# Patient Record
Sex: Female | Born: 1960 | Race: White | Hispanic: No | Smoking: Never smoker
Health system: Southern US, Community
[De-identification: ages and names within clinical notes are randomized; demographics above are authoritative.]

## PROBLEM LIST (undated history)

## (undated) DIAGNOSIS — M1990 Unspecified osteoarthritis, unspecified site: Secondary | ICD-10-CM

## (undated) DIAGNOSIS — Z806 Family history of leukemia: Secondary | ICD-10-CM

## (undated) DIAGNOSIS — M2550 Pain in unspecified joint: Secondary | ICD-10-CM

## (undated) DIAGNOSIS — Z803 Family history of malignant neoplasm of breast: Secondary | ICD-10-CM

## (undated) DIAGNOSIS — R609 Edema, unspecified: Secondary | ICD-10-CM

## (undated) DIAGNOSIS — N6099 Unspecified benign mammary dysplasia of unspecified breast: Secondary | ICD-10-CM

## (undated) DIAGNOSIS — D649 Anemia, unspecified: Secondary | ICD-10-CM

## (undated) DIAGNOSIS — Z8042 Family history of malignant neoplasm of prostate: Secondary | ICD-10-CM

## (undated) HISTORY — PX: TONSILLECTOMY: SUR1361

## (undated) HISTORY — DX: Edema, unspecified: R60.9

## (undated) HISTORY — PX: DILATION AND CURETTAGE OF UTERUS: SHX78

## (undated) HISTORY — PX: WISDOM TOOTH EXTRACTION: SHX21

## (undated) HISTORY — DX: Pain in unspecified joint: M25.50

## (undated) HISTORY — DX: Unspecified benign mammary dysplasia of unspecified breast: N60.99

## (undated) HISTORY — PX: BREAST LUMPECTOMY: SHX2

## (undated) HISTORY — DX: Family history of malignant neoplasm of prostate: Z80.42

## (undated) HISTORY — PX: NASAL SINUS SURGERY: SHX719

## (undated) HISTORY — PX: OTHER SURGICAL HISTORY: SHX169

## (undated) HISTORY — DX: Family history of leukemia: Z80.6

## (undated) HISTORY — DX: Family history of malignant neoplasm of breast: Z80.3

---

## 2005-10-30 ENCOUNTER — Encounter: Admission: RE | Admit: 2005-10-30 | Discharge: 2005-10-30 | Payer: Self-pay | Admitting: Obstetrics and Gynecology

## 2005-12-03 ENCOUNTER — Encounter: Admission: RE | Admit: 2005-12-03 | Discharge: 2005-12-03 | Payer: Self-pay | Admitting: Obstetrics and Gynecology

## 2005-12-03 ENCOUNTER — Encounter (INDEPENDENT_AMBULATORY_CARE_PROVIDER_SITE_OTHER): Payer: Self-pay | Admitting: Specialist

## 2006-11-11 ENCOUNTER — Encounter: Admission: RE | Admit: 2006-11-11 | Discharge: 2006-11-11 | Payer: Self-pay | Admitting: Obstetrics and Gynecology

## 2006-11-13 ENCOUNTER — Encounter: Admission: RE | Admit: 2006-11-13 | Discharge: 2006-11-13 | Payer: Self-pay | Admitting: Obstetrics and Gynecology

## 2006-12-09 ENCOUNTER — Encounter: Admission: RE | Admit: 2006-12-09 | Discharge: 2006-12-09 | Payer: Self-pay | Admitting: Obstetrics and Gynecology

## 2007-06-10 ENCOUNTER — Encounter: Admission: RE | Admit: 2007-06-10 | Discharge: 2007-06-10 | Payer: Self-pay | Admitting: Obstetrics and Gynecology

## 2007-11-16 ENCOUNTER — Encounter: Admission: RE | Admit: 2007-11-16 | Discharge: 2007-11-16 | Payer: Self-pay | Admitting: Obstetrics and Gynecology

## 2008-11-16 ENCOUNTER — Encounter: Admission: RE | Admit: 2008-11-16 | Discharge: 2008-11-16 | Payer: Self-pay | Admitting: Obstetrics and Gynecology

## 2009-11-26 ENCOUNTER — Encounter: Admission: RE | Admit: 2009-11-26 | Discharge: 2009-11-26 | Payer: Self-pay | Admitting: Obstetrics and Gynecology

## 2010-11-04 ENCOUNTER — Other Ambulatory Visit: Payer: Self-pay | Admitting: Obstetrics and Gynecology

## 2010-11-04 DIAGNOSIS — Z1231 Encounter for screening mammogram for malignant neoplasm of breast: Secondary | ICD-10-CM

## 2010-12-03 ENCOUNTER — Ambulatory Visit: Payer: Self-pay

## 2010-12-09 ENCOUNTER — Ambulatory Visit
Admission: RE | Admit: 2010-12-09 | Discharge: 2010-12-09 | Disposition: A | Discharge status: Home / Self Care | Payer: Managed Care, Other (non HMO) | Source: Ambulatory Visit | Attending: Obstetrics and Gynecology | Admitting: Obstetrics and Gynecology

## 2010-12-09 DIAGNOSIS — Z1231 Encounter for screening mammogram for malignant neoplasm of breast: Secondary | ICD-10-CM

## 2010-12-13 ENCOUNTER — Other Ambulatory Visit: Payer: Self-pay | Admitting: Obstetrics and Gynecology

## 2010-12-13 DIAGNOSIS — R928 Other abnormal and inconclusive findings on diagnostic imaging of breast: Secondary | ICD-10-CM

## 2010-12-27 ENCOUNTER — Other Ambulatory Visit: Payer: Self-pay | Admitting: Obstetrics and Gynecology

## 2010-12-27 ENCOUNTER — Ambulatory Visit
Admission: RE | Admit: 2010-12-27 | Discharge: 2010-12-27 | Disposition: A | Discharge status: Home / Self Care | Payer: Managed Care, Other (non HMO) | Source: Ambulatory Visit | Attending: Obstetrics and Gynecology | Admitting: Obstetrics and Gynecology

## 2010-12-27 DIAGNOSIS — R928 Other abnormal and inconclusive findings on diagnostic imaging of breast: Secondary | ICD-10-CM

## 2010-12-27 DIAGNOSIS — N6310 Unspecified lump in the right breast, unspecified quadrant: Secondary | ICD-10-CM

## 2011-11-18 ENCOUNTER — Other Ambulatory Visit: Payer: Self-pay | Admitting: Obstetrics and Gynecology

## 2011-11-18 DIAGNOSIS — Z1231 Encounter for screening mammogram for malignant neoplasm of breast: Secondary | ICD-10-CM

## 2012-01-12 ENCOUNTER — Ambulatory Visit
Admission: RE | Admit: 2012-01-12 | Discharge: 2012-01-12 | Disposition: A | Discharge status: Home / Self Care | Payer: Managed Care, Other (non HMO) | Source: Ambulatory Visit | Attending: Obstetrics and Gynecology | Admitting: Obstetrics and Gynecology

## 2012-01-12 DIAGNOSIS — Z1231 Encounter for screening mammogram for malignant neoplasm of breast: Secondary | ICD-10-CM

## 2012-11-02 ENCOUNTER — Ambulatory Visit (HOSPITAL_COMMUNITY)
Admission: RE | Admit: 2012-11-02 | Discharge: 2012-11-02 | Disposition: A | Discharge status: Home / Self Care | Payer: Managed Care, Other (non HMO) | Source: Ambulatory Visit | Attending: Cardiology | Admitting: Cardiology

## 2012-11-02 ENCOUNTER — Other Ambulatory Visit (HOSPITAL_COMMUNITY): Payer: Self-pay | Admitting: Cardiology

## 2012-11-02 DIAGNOSIS — R609 Edema, unspecified: Secondary | ICD-10-CM | POA: Insufficient documentation

## 2012-11-02 DIAGNOSIS — M79604 Pain in right leg: Secondary | ICD-10-CM

## 2012-11-02 DIAGNOSIS — R600 Localized edema: Secondary | ICD-10-CM

## 2012-11-02 DIAGNOSIS — M7989 Other specified soft tissue disorders: Secondary | ICD-10-CM

## 2012-11-02 NOTE — Progress Notes (Signed)
VASCULAR LAB PRELIMINARY  PRELIMINARY  PRELIMINARY  PRELIMINARY  Bilateral lower extremity venous duplex  completed.    Preliminary report:  Bilateral:  No evidence of DVT, superficial thrombosis, or Baker's Cyst.   Abdulahad Mederos, RVT 11/02/2012, 3:58 PM

## 2012-12-13 ENCOUNTER — Encounter: Payer: Self-pay | Admitting: Cardiology

## 2012-12-13 ENCOUNTER — Encounter: Payer: Self-pay | Admitting: *Deleted

## 2012-12-13 DIAGNOSIS — M2550 Pain in unspecified joint: Secondary | ICD-10-CM | POA: Insufficient documentation

## 2012-12-14 ENCOUNTER — Ambulatory Visit (INDEPENDENT_AMBULATORY_CARE_PROVIDER_SITE_OTHER): Payer: Managed Care, Other (non HMO) | Admitting: Cardiology

## 2012-12-14 ENCOUNTER — Encounter: Payer: Self-pay | Admitting: Cardiology

## 2012-12-14 VITALS — BP 121/64 | HR 70 | Ht 68.0 in | Wt 196.0 lb

## 2012-12-14 DIAGNOSIS — R0600 Dyspnea, unspecified: Secondary | ICD-10-CM | POA: Insufficient documentation

## 2012-12-14 DIAGNOSIS — R0609 Other forms of dyspnea: Secondary | ICD-10-CM | POA: Insufficient documentation

## 2012-12-14 DIAGNOSIS — R609 Edema, unspecified: Secondary | ICD-10-CM

## 2012-12-14 DIAGNOSIS — R0989 Other specified symptoms and signs involving the circulatory and respiratory systems: Secondary | ICD-10-CM

## 2012-12-14 DIAGNOSIS — R600 Localized edema: Secondary | ICD-10-CM | POA: Insufficient documentation

## 2012-12-14 MED ORDER — FUROSEMIDE 20 MG PO TABS: 20.0000 mg | ORAL_TABLET | ORAL | Status: DC | PRN

## 2012-12-14 NOTE — Progress Notes (Signed)
  [address] [place], [place]  [address] Phone: [phone] Fax:  [phone]  Date:  12/14/2012   ID:  Beverly Munoz, DOB 03-Apr-1960, MRN [id number]  PCP:  Pcp Not In System  Cardiologist:  Armanda Magic, MD  History of Present Illness: Beverly Munoz is a 52 y.o. female who saw me recently for SOB and LE edema after a trip to [place].  She was initially placed on Lasix with some improvement in LE edema.  Her SOB with mainly with exertion.  2D echo showed normal LVF with trivial TR, normal TSH/BNP/d-dimer, no DVT by LE venous dopplers.  She presents today for followup. She occasionally has some LE edema when standing on her legs for long periods of time.  She still has some DOE when going up stairs.   Wt Readings from Last 3 Encounters:  12/14/12 196 lb (88.905 kg)     Past Medical History  Diagnosis Date  . Pain in joint   . Edema     no DVT on venous dopplers, nl LVF on echo, normal BNP/TSH    Current Outpatient Prescriptions  Medication Sig Dispense Refill  . Ascorbic Acid (VITAMIN C) 100 MG tablet Take 100 mg by mouth daily.      . B Complex Vitamins (VITAMIN B COMPLEX PO) Take 1 tablet by mouth daily.      . Cholecalciferol (VITAMIN D-3) 1000 UNITS CAPS Take 1 capsule by mouth daily.      . cycloSPORINE (RESTASIS) 0.05 % ophthalmic emulsion 1 drop 2 (two) times daily.      . fish oil-omega-3 fatty acids 1000 MG capsule Take 2 g by mouth daily.      . Multiple Vitamin (MULTIVITAMIN) capsule Take 1 capsule by mouth daily.       No current facility-administered medications for this visit.    Allergies:    Allergies  Allergen Reactions  . Penicillins     Social History:  The patient  reports that she has never smoked. She does not have any smokeless tobacco history on file. She reports that she drinks alcohol. She reports that she does not use illicit drugs.   Family History:  The patient's family history includes Breast cancer in her mother; Heart failure in her  father.   ROS:  Please see the history of present illness.      All other systems reviewed and negative.   PHYSICAL EXAM: VS:  BP 121/64  Pulse 70  Ht 5\' 8"  (1.727 m)  Wt 196 lb (88.905 kg)  BMI 29.81 kg/m2 Well nourished, well developed, in no acute distress HEENT: normal Neck: no JVD Cardiac:  normal S1, S2; RRR; no murmur Lungs:  clear to auscultation bilaterally, no wheezing, rhonchi or rales Abd: soft, nontender, no hepatomegaly Ext: no edema Skin: warm and dry Neuro:  CNs 2-12 intact, no focal abnormalities noted       ASSESSMENT AND PLAN:  1. LE edema - none on exam today.  She is getting ready to go on a trip to [place] for Thanksgiving and she is concerned that she may some LE edema.  I have given her a prescription for Lasix 20mg  to take daily as needed for edema. I have also encouraged her to walk a lot on the plane. 2. DOE - I suspect that this is due to obesity.  I will get a nuclear stress to rule out ischemia.  Followup PRN  Signed, Armanda Magic, MD 12/14/2012 3:36 PM

## 2012-12-14 NOTE — Patient Instructions (Signed)
Start Lasix 20 MG PRN for swelling  Your physician has requested that you have en exercise stress myoview. For further information please visit [email or web address] Please follow instruction sheet, as given.  Your physician recommends that you schedule a follow-up appointment in: As needed with Dr. Mayford Knife

## 2012-12-28 ENCOUNTER — Ambulatory Visit (HOSPITAL_COMMUNITY): Payer: Managed Care, Other (non HMO) | Attending: Cardiology | Admitting: Radiology

## 2012-12-28 ENCOUNTER — Encounter: Payer: Self-pay | Admitting: Cardiology

## 2012-12-28 VITALS — BP 106/70 | HR 67 | Ht 68.0 in | Wt 196.0 lb

## 2012-12-28 DIAGNOSIS — R0602 Shortness of breath: Secondary | ICD-10-CM

## 2012-12-28 DIAGNOSIS — R0600 Dyspnea, unspecified: Secondary | ICD-10-CM

## 2012-12-28 DIAGNOSIS — R0609 Other forms of dyspnea: Secondary | ICD-10-CM | POA: Insufficient documentation

## 2012-12-28 DIAGNOSIS — R0989 Other specified symptoms and signs involving the circulatory and respiratory systems: Secondary | ICD-10-CM | POA: Insufficient documentation

## 2012-12-28 MED ORDER — TECHNETIUM TC 99M SESTAMIBI GENERIC - CARDIOLITE
11.0000 | Freq: Once | INTRAVENOUS | Status: AC | PRN
  Administered 2012-12-28: 11 via INTRAVENOUS

## 2012-12-28 MED ORDER — TECHNETIUM TC 99M SESTAMIBI GENERIC - CARDIOLITE
33.0000 | Freq: Once | INTRAVENOUS | Status: AC | PRN
  Administered 2012-12-28: 33 via INTRAVENOUS

## 2012-12-28 NOTE — Progress Notes (Signed)
[facility] SITE 3 NUCLEAR MED [address] [place], [place] [address] [phone]    Cardiology Nuclear Med Study  Beverly Munoz is a 52 y.o. female     MRN : [id number]     DOB: [birth year]  Procedure Date: 12/28/2012  Nuclear Med Background Indication for Stress Test:  Evaluation for Ischemia History:  No known CAD, Echo 10/2012 EF 63% Cardiac Risk Factors: None  Symptoms:  DOE   Nuclear Pre-Procedure Caffeine/Decaff Intake:  None > 12 hrs  NPO After: 7:00pm   Lungs:  clear O2 Sat: 98% on room air. IV 0.9% NS with Angio Cath:  22g  IV Site: R Forearm x 1, tolerated well IV Started by:  Irean Hong, RN  Chest Size (in):  40 Cup Size: C  Height: 5\' 8"  (1.727 m)  Weight:  196 lb (88.905 kg)  BMI:  Body mass index is 29.81 kg/(m^2). Tech Comments:  NA    Nuclear Med Study 1 or 2 day study: 1 day  Stress Test Type:  Stress  Reading MD: Armanda Magic, MD  Order Authorizing Provider:  Armanda Magic, MD  Resting Radionuclide: Technetium 53m Sestamibi  Resting Radionuclide Dose: 11.0 mCi   Stress Radionuclide:  Technetium 83m Sestamibi  Stress Radionuclide Dose: 33.0 mCi           Stress Protocol Rest HR: 67 Stress HR: 173  Rest BP: 106/70 Stress BP: 138/64  Exercise Time (min): 7:00 METS: 8.5   Predicted Max HR: 170 bpm % Max HR: 101.76 bpm Rate Pressure Product: 24401   Dose of Adenosine (mg):  n/a Dose of Lexiscan: n/a mg  Dose of Atropine (mg): n/a Dose of Dobutamine: n/a mcg/kg/min (at max HR)  Stress Test Technologist: Nelson Chimes, BS-ES  Nuclear Technologist:  Doyne Keel, CNMT     Rest Procedure:  Myocardial perfusion imaging was performed at rest 45 minutes following the intravenous administration of Technetium 8m Sestamibi. Rest ECG: NSR - Normal EKG  Stress Procedure:  The patient exercised on the treadmill utilizing the Bruce Protocol for 7:00 minutes. The patient stopped due to SOB and denied any chest pain.  Technetium 39m Sestamibi was  injected at peak exercise and myocardial perfusion imaging was performed after a brief delay. Symptoms resolved in recovery. Stress ECG: 1mm of J point depression with horizontal ST segment depression  QPS Raw Data Images:  There is interference from nuclear activity from structures below the diaphragm. This does not affect the ability to read the study. Stress Images:  There is decreased uptake in the anterior wall primarily in the mid anterior wall.   Rest Images:  Normal homogeneous uptake in all areas of the myocardium. Subtraction (SDS):  These findings are consistent with ischemia. Transient Ischemic Dilatation (Normal <1.22):  0.93 Lung/Heart Ratio (Normal <0.45):  0.29  Quantitative Gated Spect Images QGS EDV:  69 ml QGS ESV:  25 ml  Impression Exercise Capacity:  Good exercise capacity. BP Response:  Normal blood pressure response. Clinical Symptoms:  There is dyspnea. ECG Impression:  Significant ST abnormalities consistent with ischemia. Comparison with Prior Nuclear Study: No images to compare  Overall Impression:  Intermediate risk stress nuclear study small reversible defect in the mid anterior wall most c/w with ischemia in a diagonal branch although cannot rule out LAD disease.  This could also be due to variations in breast attenuation.  Clinical correlation is recommended.  LV Ejection Fraction: 64%.  LV Wall Motion:  NL LV Function; NL Wall  Motion   Signed:   Armanda Magic, MD 12/29/2012

## 2012-12-29 ENCOUNTER — Other Ambulatory Visit: Payer: Self-pay

## 2012-12-29 DIAGNOSIS — Z1231 Encounter for screening mammogram for malignant neoplasm of breast: Secondary | ICD-10-CM

## 2012-12-30 ENCOUNTER — Other Ambulatory Visit: Payer: Self-pay | Admitting: General Surgery

## 2012-12-30 ENCOUNTER — Encounter (HOSPITAL_COMMUNITY): Payer: Self-pay | Admitting: Pharmacy Technician

## 2012-12-30 ENCOUNTER — Encounter: Payer: Self-pay | Admitting: General Surgery

## 2012-12-30 DIAGNOSIS — R0602 Shortness of breath: Secondary | ICD-10-CM

## 2012-12-31 ENCOUNTER — Telehealth: Payer: Self-pay | Admitting: Cardiology

## 2012-12-31 ENCOUNTER — Other Ambulatory Visit (INDEPENDENT_AMBULATORY_CARE_PROVIDER_SITE_OTHER): Payer: Managed Care, Other (non HMO)

## 2012-12-31 DIAGNOSIS — R0602 Shortness of breath: Secondary | ICD-10-CM

## 2012-12-31 LAB — CBC WITH DIFFERENTIAL/PLATELET
Eosinophils Relative: 2.6 % (ref 0.0–5.0)
HCT: 38.5 % (ref 36.0–46.0)
Lymphs Abs: 1.8 10*3/uL (ref 0.7–4.0)
MCHC: 33.9 g/dL (ref 30.0–36.0)
MCV: 84.6 fl (ref 78.0–100.0)
Monocytes Absolute: 0.2 10*3/uL (ref 0.1–1.0)
Platelets: 260 10*3/uL (ref 150.0–400.0)
WBC: 5.9 10*3/uL (ref 4.5–10.5)

## 2012-12-31 LAB — BASIC METABOLIC PANEL
BUN: 12 mg/dL (ref 6–23)
CO2: 27 mEq/L (ref 19–32)
Chloride: 105 mEq/L (ref 96–112)
Glucose, Bld: 100 mg/dL — ABNORMAL HIGH (ref 70–99)
Potassium: 4.1 mEq/L (ref 3.5–5.1)

## 2012-12-31 LAB — PROTIME-INR: Prothrombin Time: 11.3 s (ref 10.2–12.4)

## 2012-12-31 NOTE — Telephone Encounter (Signed)
Called and spoke to pt. I will get paper work and fax over to pts office at [phone]

## 2012-12-31 NOTE — Telephone Encounter (Signed)
Follow UP   Pt called for paperwork that she forgot to pick up for procedure on Monday // She asks that you please fax to [phone] (public fax) or she can simply come by to pick it up// Please advise

## 2013-01-03 ENCOUNTER — Ambulatory Visit (HOSPITAL_COMMUNITY)
Admission: RE | Admit: 2013-01-03 | Discharge: 2013-01-03 | Disposition: A | Discharge status: Home / Self Care | Payer: Managed Care, Other (non HMO) | Source: Ambulatory Visit | Attending: Cardiology | Admitting: Cardiology

## 2013-01-03 ENCOUNTER — Encounter (HOSPITAL_COMMUNITY)
Admission: RE | Disposition: A | Discharge status: Home / Self Care | Payer: Self-pay | Source: Ambulatory Visit | Attending: Cardiology

## 2013-01-03 DIAGNOSIS — Z803 Family history of malignant neoplasm of breast: Secondary | ICD-10-CM | POA: Insufficient documentation

## 2013-01-03 DIAGNOSIS — R9439 Abnormal result of other cardiovascular function study: Secondary | ICD-10-CM

## 2013-01-03 DIAGNOSIS — Z6829 Body mass index (BMI) 29.0-29.9, adult: Secondary | ICD-10-CM | POA: Insufficient documentation

## 2013-01-03 DIAGNOSIS — E669 Obesity, unspecified: Secondary | ICD-10-CM | POA: Insufficient documentation

## 2013-01-03 DIAGNOSIS — R0600 Dyspnea, unspecified: Secondary | ICD-10-CM

## 2013-01-03 DIAGNOSIS — R0602 Shortness of breath: Secondary | ICD-10-CM | POA: Insufficient documentation

## 2013-01-03 DIAGNOSIS — R0609 Other forms of dyspnea: Secondary | ICD-10-CM

## 2013-01-03 DIAGNOSIS — R0989 Other specified symptoms and signs involving the circulatory and respiratory systems: Secondary | ICD-10-CM

## 2013-01-03 DIAGNOSIS — R609 Edema, unspecified: Secondary | ICD-10-CM | POA: Insufficient documentation

## 2013-01-03 DIAGNOSIS — R931 Abnormal findings on diagnostic imaging of heart and coronary circulation: Secondary | ICD-10-CM | POA: Diagnosis present

## 2013-01-03 DIAGNOSIS — R600 Localized edema: Secondary | ICD-10-CM

## 2013-01-03 HISTORY — PX: LEFT HEART CATHETERIZATION WITH CORONARY ANGIOGRAM: SHX5451

## 2013-01-03 LAB — PREGNANCY, URINE: Preg Test, Ur: NEGATIVE

## 2013-01-03 SURGERY — LEFT HEART CATHETERIZATION WITH CORONARY ANGIOGRAM
Anesthesia: LOCAL

## 2013-01-03 MED ORDER — MIDAZOLAM HCL 2 MG/2ML IJ SOLN
INTRAMUSCULAR | Status: AC
  Filled 2013-01-03: qty 2

## 2013-01-03 MED ORDER — NITROGLYCERIN 0.2 MG/ML ON CALL CATH LAB
INTRAVENOUS | Status: AC
  Filled 2013-01-03: qty 1

## 2013-01-03 MED ORDER — FENTANYL CITRATE 0.05 MG/ML IJ SOLN
INTRAMUSCULAR | Status: AC
  Filled 2013-01-03: qty 2

## 2013-01-03 MED ORDER — SODIUM CHLORIDE 0.9 % IV SOLN: 1.0000 mL/kg/h | INTRAVENOUS | Status: DC

## 2013-01-03 MED ORDER — SODIUM CHLORIDE 0.9 % IV SOLN: 250.0000 mL | INTRAVENOUS | Status: DC | PRN

## 2013-01-03 MED ORDER — HEPARIN (PORCINE) IN NACL 2-0.9 UNIT/ML-% IJ SOLN
INTRAMUSCULAR | Status: AC
  Filled 2013-01-03: qty 1000

## 2013-01-03 MED ORDER — ASPIRIN 81 MG PO CHEW
81.0000 mg | CHEWABLE_TABLET | ORAL | Status: AC
  Administered 2013-01-03: 81 mg via ORAL

## 2013-01-03 MED ORDER — SODIUM CHLORIDE 0.9 % IJ SOLN: 3.0000 mL | INTRAMUSCULAR | Status: DC | PRN

## 2013-01-03 MED ORDER — ACETAMINOPHEN 325 MG PO TABS
650.0000 mg | ORAL_TABLET | ORAL | Status: DC | PRN
  Administered 2013-01-03: 650 mg via ORAL

## 2013-01-03 MED ORDER — SODIUM CHLORIDE 0.9 % IJ SOLN: 3.0000 mL | Freq: Two times a day (BID) | INTRAMUSCULAR | Status: DC

## 2013-01-03 MED ORDER — TRAMADOL HCL 50 MG PO TABS
100.0000 mg | ORAL_TABLET | Freq: Once | ORAL | Status: AC
  Administered 2013-01-03: 100 mg via ORAL
  Filled 2013-01-03: qty 2

## 2013-01-03 MED ORDER — ASPIRIN 81 MG PO CHEW
CHEWABLE_TABLET | ORAL | Status: AC
  Filled 2013-01-03: qty 1

## 2013-01-03 MED ORDER — SODIUM CHLORIDE 0.9 % IV SOLN
INTRAVENOUS | Status: DC
  Administered 2013-01-03: 07:00:00 via INTRAVENOUS

## 2013-01-03 MED ORDER — ONDANSETRON HCL 4 MG/2ML IJ SOLN: 4.0000 mg | Freq: Four times a day (QID) | INTRAMUSCULAR | Status: DC | PRN

## 2013-01-03 MED ORDER — LIDOCAINE HCL (PF) 1 % IJ SOLN
INTRAMUSCULAR | Status: AC
  Filled 2013-01-03: qty 30

## 2013-01-03 MED ORDER — ACETAMINOPHEN 325 MG PO TABS
ORAL_TABLET | ORAL | Status: AC
  Filled 2013-01-03: qty 2

## 2013-01-03 NOTE — Interval H&P Note (Signed)
History and Physical Interval Note:  01/03/2013 7:41 AM  Beverly Munoz  has presented today for surgery, with the diagnosis of abnormal stress test  The various methods of treatment have been discussed with the patient and family. After consideration of risks, benefits and other options for treatment, the patient has consented to  Procedure(s): LEFT HEART CATHETERIZATION WITH CORONARY ANGIOGRAM (N/A) as a surgical intervention .  The patient's history has been reviewed, patient examined, no change in status, stable for surgery.  I have reviewed the patient's chart and labs.  Questions were answered to the patient's satisfaction.     Zacari Stiff R

## 2013-01-03 NOTE — CV Procedure (Signed)
   PROCEDURE:  Left heart catheterization with selective coronary angiography, left ventriculogram.  INDICATIONS:    The risks, benefits, and details of the procedure were explained to the patient.  The patient verbalized understanding and wanted to proceed.  Informed written consent was obtained.  PROCEDURE TECHNIQUE:  After Xylocaine anesthesia a 65F sheath was placed in the right femoral artery with a single anterior needle wall stick.   Left coronary angiography was done using a Judkins L4 guide catheter.  Right coronary angiography was done using a Judkins R4 guide catheter.  Left ventriculography was done using a pigtail catheter.    CONTRAST:  Total of 75 cc.  COMPLICATIONS:  None.    HEMODYNAMICS:  Aortic pressure was 125/54mmHg; LV pressure was 110/63mmHg; LVEDP 12-74mmHg.  There was no gradient between the left ventricle and aorta.    ANGIOGRAPHIC DATA:   The left main coronary artery is widely patent and bifurcates into an LAD and left circumflex arteries.  The left anterior descending artery is widely patent and gives rise to a large first diagonal which is widely patent.    The left circumflex artery is widely patent and give rise to a small first OM which is patent.  The ongoing circ traverses the AV groove and gives rise to a large OM1 which is widely patent and bifurcates distally into 2 daughter branches which are widely patent.  The ongoing circ is small and patent.  The right coronary artery is widely patent throughout its course and distally give rise to a PDA and PL both of which are patent.  LEFT VENTRICULOGRAM:  Left ventricular angiogram was done in the 30 RAO projection and revealed normal left ventricular wall motion and systolic function with an estimated ejection fraction of 55%.  LVEDP was 12-15 mmHg.  IMPRESSIONS:  1. Normal left main coronary artery. 2. Normal left anterior descending artery and its branches. 3. Normal left circumflex artery and its  branches. 4. Normal right coronary artery. 5. Normal left ventricular systolic function.  LVEDP 12-15 mmHg.  Ejection fraction 55%.  RECOMMENDATION:   1.  D/C home once bedrest and IVF hydration complete 2.  Followup with my NP in office for groin check in 2 weeks 3.  Followup with PCP for workup of noncardiac SOB

## 2013-01-03 NOTE — H&P (Signed)
ID: Beverly Munoz, DOB [birth year], MRN [id number]  PCP: Pcp Not In System  Cardiologist: Armanda Magic, MD  History of Present Illness:  Beverly Munoz is a 52 y.o. female who saw me recently for SOB and LE edema after a trip to [place]. She was initially placed on Lasix with some improvement in LE edema. Her SOB with mainly with exertion. 2D echo showed normal LVF with trivial TR, normal TSH/BNP/d-dimer, no DVT by LE venous dopplers. She presents today for followup. She occasionally has some LE edema when standing on her legs for long periods of time. She still has some DOE when going up stairs.  Wt Readings from Last 3 Encounters:   12/14/12  196 lb (88.905 kg)    Past Medical History   Diagnosis  Date   .  Pain in joint    .  Edema      no DVT on venous dopplers, nl LVF on echo, normal BNP/TSH    Current Outpatient Prescriptions   Medication  Sig  Dispense  Refill   .  Ascorbic Acid (VITAMIN C) 100 MG tablet  Take 100 mg by mouth daily.     .  B Complex Vitamins (VITAMIN B COMPLEX PO)  Take 1 tablet by mouth daily.     .  Cholecalciferol (VITAMIN D-3) 1000 UNITS CAPS  Take 1 capsule by mouth daily.     .  cycloSPORINE (RESTASIS) 0.05 % ophthalmic emulsion  1 drop 2 (two) times daily.     .  fish oil-omega-3 fatty acids 1000 MG capsule  Take 2 g by mouth daily.     .  Multiple Vitamin (MULTIVITAMIN) capsule  Take 1 capsule by mouth daily.      No current facility-administered medications for this visit.   Allergies:  Allergies   Allergen  Reactions   .  Penicillins    Social History: The patient reports that she has never smoked. She does not have any smokeless tobacco history on file. She reports that she drinks alcohol. She reports that she does not use illicit drugs.  Family History: The patient's family history includes Breast cancer in her mother; Heart failure in her father.  ROS: Please see the history of present illness. All other systems reviewed and negative.  PHYSICAL EXAM:  VS: BP  121/64  Pulse 70  Ht 5\' 8"  (1.727 m)  Wt 196 lb (88.905 kg)  BMI 29.81 kg/m2  Well nourished, well developed, in no acute distress  HEENT: normal  Neck: no JVD  Cardiac: normal S1, S2; RRR; no murmur  Lungs: clear to auscultation bilaterally, no wheezing, rhonchi or rales  Abd: soft, nontender, no hepatomegaly  Ext: no edema  Skin: warm and dry  Neuro: CNs 2-12 intact, no focal abnormalities noted  ASSESSMENT AND PLAN:  1. LE edema - none on exam today. She is getting ready to go on a trip to [place] for Thanksgiving and she is concerned that she may some LE edema. I have given her a prescription for Lasix 20mg  to take daily as needed for edema. I have also encouraged her to walk a lot on the plane. 2.  DOE - I suspect that this is due to obesity but it has continued. Nuclear stress showed a reversible defect in the anterior wall c/w ischemia.  Will proceed with heart catheterization to determine coronary anatomy.Cardiac catheterization was discussed with the patient fully including risks on myocardial infarction, death, stroke, bleeding, arrhythmia, dye allergy, renal insufficiency or bleeding.  All patient questions and concerns were discussed and the patient understands and is willing to proceed.

## 2013-01-03 NOTE — Interval H&P Note (Signed)
Cath Lab Visit (complete for each Cath Lab visit)  Clinical Evaluation Leading to the Procedure:   ACS: no  Non-ACS:    Anginal Classification: CCS II  Anti-ischemic medical therapy: No Therapy  Non-Invasive Test Results: Intermediate-risk stress test findings: cardiac mortality 1-3%/year  Prior CABG: No previous CABG      History and Physical Interval Note:  01/03/2013 7:41 AM  Drexel Iha  has presented today for surgery, with the diagnosis of abnormal stress test  The various methods of treatment have been discussed with the patient and family. After consideration of risks, benefits and other options for treatment, the patient has consented to  Procedure(s): LEFT HEART CATHETERIZATION WITH CORONARY ANGIOGRAM (N/A) as a surgical intervention .  The patient's history has been reviewed, patient examined, no change in status, stable for surgery.  I have reviewed the patient's chart and labs.  Questions were answered to the patient's satisfaction.     TURNER,TRACI R

## 2013-02-02 ENCOUNTER — Ambulatory Visit: Payer: Managed Care, Other (non HMO)

## 2013-02-25 ENCOUNTER — Ambulatory Visit
Admission: RE | Admit: 2013-02-25 | Discharge: 2013-02-25 | Disposition: A | Discharge status: Home / Self Care | Payer: Managed Care, Other (non HMO) | Source: Ambulatory Visit

## 2013-02-25 DIAGNOSIS — Z1231 Encounter for screening mammogram for malignant neoplasm of breast: Secondary | ICD-10-CM

## 2013-05-18 ENCOUNTER — Ambulatory Visit: Payer: Self-pay | Admitting: Cardiology

## 2013-06-03 ENCOUNTER — Encounter: Payer: Self-pay | Admitting: Cardiology

## 2014-01-23 ENCOUNTER — Encounter (INDEPENDENT_AMBULATORY_CARE_PROVIDER_SITE_OTHER): Payer: Self-pay

## 2014-01-23 ENCOUNTER — Ambulatory Visit
Admission: RE | Admit: 2014-01-23 | Discharge: 2014-01-23 | Disposition: A | Discharge status: Home / Self Care | Payer: Managed Care, Other (non HMO) | Source: Ambulatory Visit | Attending: Otolaryngology | Admitting: Otolaryngology

## 2014-01-23 ENCOUNTER — Other Ambulatory Visit: Payer: Self-pay | Admitting: Otolaryngology

## 2014-01-23 DIAGNOSIS — R059 Cough, unspecified: Secondary | ICD-10-CM

## 2014-01-23 DIAGNOSIS — R05 Cough: Secondary | ICD-10-CM

## 2014-01-26 ENCOUNTER — Encounter (HOSPITAL_COMMUNITY): Payer: Self-pay | Admitting: Cardiology

## 2014-03-08 ENCOUNTER — Other Ambulatory Visit: Payer: Self-pay

## 2014-03-08 DIAGNOSIS — Z1231 Encounter for screening mammogram for malignant neoplasm of breast: Secondary | ICD-10-CM

## 2014-04-03 ENCOUNTER — Ambulatory Visit: Payer: Managed Care, Other (non HMO)

## 2014-04-12 ENCOUNTER — Ambulatory Visit: Payer: Managed Care, Other (non HMO)

## 2014-05-02 ENCOUNTER — Ambulatory Visit
Admission: RE | Admit: 2014-05-02 | Discharge: 2014-05-02 | Disposition: A | Discharge status: Home / Self Care | Payer: Managed Care, Other (non HMO) | Source: Ambulatory Visit

## 2014-05-02 DIAGNOSIS — Z1231 Encounter for screening mammogram for malignant neoplasm of breast: Secondary | ICD-10-CM

## 2014-12-19 HISTORY — PX: COLONOSCOPY: SHX174

## 2015-08-06 ENCOUNTER — Encounter (HOSPITAL_COMMUNITY): Payer: Self-pay | Admitting: *Deleted

## 2015-08-09 ENCOUNTER — Other Ambulatory Visit: Payer: Self-pay | Admitting: Obstetrics and Gynecology

## 2015-08-17 NOTE — Anesthesia Preprocedure Evaluation (Addendum)
Anesthesia Evaluation  Patient identified by MRN, date of birth, ID band Patient awake    Reviewed: Allergy & Precautions, NPO status , Patient's Chart, lab work & pertinent test results  Airway Mallampati: II   Neck ROM: Full    Dental  (+) Teeth Intact, Dental Advisory Given   Pulmonary neg pulmonary ROS,    breath sounds clear to auscultation       Cardiovascular negative cardio ROS   Rhythm:Regular  CATH 2014 EF 55%, normal coronaries     Neuro/Psych negative neurological ROS  negative psych ROS   GI/Hepatic negative GI ROS, Neg liver ROS,   Endo/Other  Obesity, BMI 32  Renal/GU negative Renal ROS  negative genitourinary   Musculoskeletal negative musculoskeletal ROS (+)   Abdominal   Peds negative pediatric ROS (+)  Hematology negative hematology ROS (+)   Anesthesia Other Findings   Reproductive/Obstetrics negative OB ROS                            Anesthesia Physical Anesthesia Plan  ASA: II  Anesthesia Plan: General   Post-op Pain Management:    Induction: Intravenous  Airway Management Planned: LMA  Additional Equipment:   Intra-op Plan:   Post-operative Plan: Extubation in OR  Informed Consent: I have reviewed the patients History and Physical, chart, labs and discussed the procedure including the risks, benefits and alternatives for the proposed anesthesia with the patient or authorized representative who has indicated his/her understanding and acceptance.     Plan Discussed with:   Anesthesia Plan Comments: (Check AM Labs, had negative cardiac WU including CATH in 2014, treated for lower extremity swelling)        Anesthesia Quick Evaluation

## 2015-08-22 NOTE — H&P (Signed)
NAMELILLIAM, OZOG NO.:  [id number]  MEDICAL RECORD NO.:  [id number]  LOCATION:  PERIO                         FACILITY:  [facility]  PHYSICIAN:  Lovenia Kim, M.D.DATE OF BIRTH:  06-01-1960  DATE OF ADMISSION:  08/02/2015 DATE OF DISCHARGE:                             HISTORY & PHYSICAL   CHIEF COMPLAINT:  Menometrorrhagia with a history of secondary anemia and fibroids.  HISTORY OF PRESENT ILLNESS:  A 55 year old white female, G5 P1, who presents with aforementioned indications for surgical intervention.  ALLERGIES:  PENICILLIN.  FAMILY HISTORY:  She has a family history of breast cancer, prostate cancer.  MEDICATIONS:  Include Restasis.  She is a nonsmoker, nondrinker.  Denies domestic or physical violence.  SURGICAL HISTORY:  Remarkable for myomectomy and cesarean section.  PHYSICAL EXAMINATION:  GENERAL:  A well-developed, well-nourished, white female, in no acute distress. HEENT:  Normal. NECK:  Supple.  Full range of motion. LUNGS:  Clear. HEART:  Regular rate and rhythm. ABDOMEN:  Soft, nontender.  Pelvic exam reveals a bulky mid positioned anteflexed uterus and no adnexal masses. EXTREMITIES:  There are no cords. NEUROLOGIC:  Nonfocal. SKIN:  Intact.  IMPRESSION:  Menometrorrhagia with questionable structural lesion with questionable submucous fibroid noted along the anterior wall.  PLAN:  Proceed with diagnostic hysteroscopy, D and C, MyoSure resection and NovaSure endometrial ablation.  Risks of anesthesia, infection, bleeding, injury to surrounding organs, possible need for repair was discussed, delayed versus immediate complications to include bowel and bladder injury noted.  The patient acknowledges and wishes to proceed.     Lovenia Kim, M.D.     RJT/MEDQ  D:  08/22/2015  T:  08/22/2015  Job:  [id number]

## 2015-08-23 ENCOUNTER — Ambulatory Visit (HOSPITAL_COMMUNITY)
Admission: RE | Admit: 2015-08-23 | Discharge: 2015-08-23 | Disposition: A | Discharge status: Home / Self Care | Payer: Managed Care, Other (non HMO) | Source: Ambulatory Visit | Attending: Obstetrics and Gynecology | Admitting: Obstetrics and Gynecology

## 2015-08-23 ENCOUNTER — Encounter (HOSPITAL_COMMUNITY)
Admission: RE | Disposition: A | Discharge status: Home / Self Care | Payer: Self-pay | Source: Ambulatory Visit | Attending: Obstetrics and Gynecology

## 2015-08-23 ENCOUNTER — Ambulatory Visit (HOSPITAL_COMMUNITY): Payer: Managed Care, Other (non HMO) | Admitting: Anesthesiology

## 2015-08-23 ENCOUNTER — Encounter (HOSPITAL_COMMUNITY): Payer: Self-pay | Admitting: *Deleted

## 2015-08-23 DIAGNOSIS — Z6832 Body mass index (BMI) 32.0-32.9, adult: Secondary | ICD-10-CM | POA: Insufficient documentation

## 2015-08-23 DIAGNOSIS — Z880 Allergy status to penicillin: Secondary | ICD-10-CM | POA: Diagnosis not present

## 2015-08-23 DIAGNOSIS — N840 Polyp of corpus uteri: Secondary | ICD-10-CM | POA: Diagnosis not present

## 2015-08-23 DIAGNOSIS — E669 Obesity, unspecified: Secondary | ICD-10-CM | POA: Diagnosis not present

## 2015-08-23 DIAGNOSIS — Z803 Family history of malignant neoplasm of breast: Secondary | ICD-10-CM | POA: Insufficient documentation

## 2015-08-23 DIAGNOSIS — D649 Anemia, unspecified: Secondary | ICD-10-CM | POA: Diagnosis not present

## 2015-08-23 DIAGNOSIS — N920 Excessive and frequent menstruation with regular cycle: Secondary | ICD-10-CM | POA: Diagnosis present

## 2015-08-23 DIAGNOSIS — D250 Submucous leiomyoma of uterus: Secondary | ICD-10-CM | POA: Diagnosis not present

## 2015-08-23 HISTORY — PX: DILATATION & CURETTAGE/HYSTEROSCOPY WITH MYOSURE: SHX6511

## 2015-08-23 HISTORY — DX: Unspecified osteoarthritis, unspecified site: M19.90

## 2015-08-23 HISTORY — PX: HYSTEROSCOPY WITH NOVASURE: SHX5574

## 2015-08-23 LAB — CBC
HEMATOCRIT: 31.5 % — AB (ref 36.0–46.0)
HEMOGLOBIN: 9.6 g/dL — AB (ref 12.0–15.0)
MCH: 22.1 pg — AB (ref 26.0–34.0)
MCHC: 30.5 g/dL (ref 30.0–36.0)
MCV: 72.4 fL — AB (ref 78.0–100.0)
Platelets: 370 10*3/uL (ref 150–400)
RBC: 4.35 MIL/uL (ref 3.87–5.11)
RDW: 15.6 % — ABNORMAL HIGH (ref 11.5–15.5)
WBC: 7.4 10*3/uL (ref 4.0–10.5)

## 2015-08-23 LAB — BASIC METABOLIC PANEL
Anion gap: 4 — ABNORMAL LOW (ref 5–15)
BUN: 12 mg/dL (ref 6–20)
CALCIUM: 9 mg/dL (ref 8.9–10.3)
CO2: 28 mmol/L (ref 22–32)
CREATININE: 0.92 mg/dL (ref 0.44–1.00)
Chloride: 106 mmol/L (ref 101–111)
GFR calc Af Amer: >60 mL/min (ref >60)
GLUCOSE: 102 mg/dL — AB (ref 65–99)
POTASSIUM: 4.5 mmol/L (ref 3.5–5.1)
SODIUM: 138 mmol/L (ref 135–145)

## 2015-08-23 LAB — HCG, SERUM, QUALITATIVE: Preg, Serum: NEGATIVE

## 2015-08-23 SURGERY — DILATATION & CURETTAGE/HYSTEROSCOPY WITH MYOSURE
Anesthesia: General

## 2015-08-23 MED ORDER — CEFAZOLIN SODIUM-DEXTROSE 2-4 GM/100ML-% IV SOLN
2.0000 g | INTRAVENOUS | Status: AC
  Administered 2015-08-23: 2 g via INTRAVENOUS

## 2015-08-23 MED ORDER — DEXAMETHASONE SODIUM PHOSPHATE 10 MG/ML IJ SOLN
INTRAMUSCULAR | Status: DC | PRN
  Administered 2015-08-23: 4 mg via INTRAVENOUS

## 2015-08-23 MED ORDER — LACTATED RINGERS IV SOLN
INTRAVENOUS | Status: DC
  Administered 2015-08-23: 09:00:00 via INTRAVENOUS

## 2015-08-23 MED ORDER — KETOROLAC TROMETHAMINE 30 MG/ML IJ SOLN
INTRAMUSCULAR | Status: DC | PRN
  Administered 2015-08-23: 30 mg via INTRAVENOUS

## 2015-08-23 MED ORDER — SCOPOLAMINE 1 MG/3DAYS TD PT72
1.0000 | MEDICATED_PATCH | Freq: Once | TRANSDERMAL | Status: DC
  Administered 2015-08-23: 1.5 mg via TRANSDERMAL

## 2015-08-23 MED ORDER — VASOPRESSIN 20 UNIT/ML IV SOLN
INTRAVENOUS | Status: AC
  Filled 2015-08-23: qty 1

## 2015-08-23 MED ORDER — PROPOFOL 10 MG/ML IV BOLUS
INTRAVENOUS | Status: DC | PRN
  Administered 2015-08-23: 170 mg via INTRAVENOUS

## 2015-08-23 MED ORDER — PROMETHAZINE HCL 25 MG/ML IJ SOLN
6.2500 mg | INTRAMUSCULAR | Status: DC | PRN
Start: 2015-08-23 — End: 2015-08-23

## 2015-08-23 MED ORDER — MEPERIDINE HCL 25 MG/ML IJ SOLN: 6.2500 mg | INTRAMUSCULAR | Status: DC | PRN

## 2015-08-23 MED ORDER — LIDOCAINE HCL (CARDIAC) 20 MG/ML IV SOLN
INTRAVENOUS | Status: AC
  Filled 2015-08-23: qty 5

## 2015-08-23 MED ORDER — VASOPRESSIN 20 UNIT/ML IV SOLN
INTRAVENOUS | Status: DC | PRN
  Administered 2015-08-23: 20 [IU]

## 2015-08-23 MED ORDER — SCOPOLAMINE 1 MG/3DAYS TD PT72
MEDICATED_PATCH | TRANSDERMAL | Status: DC
Start: 2015-08-23 — End: 2015-08-23
  Administered 2015-08-23: 1.5 mg via TRANSDERMAL
  Filled 2015-08-23: qty 1

## 2015-08-23 MED ORDER — ONDANSETRON HCL 4 MG/2ML IJ SOLN
INTRAMUSCULAR | Status: DC | PRN
  Administered 2015-08-23: 4 mg via INTRAVENOUS

## 2015-08-23 MED ORDER — ONDANSETRON HCL 4 MG/2ML IJ SOLN
INTRAMUSCULAR | Status: AC
  Filled 2015-08-23: qty 2

## 2015-08-23 MED ORDER — SODIUM CHLORIDE 0.9 % IR SOLN
Status: DC | PRN
  Administered 2015-08-23: 3000 mL

## 2015-08-23 MED ORDER — KETOROLAC TROMETHAMINE 30 MG/ML IJ SOLN
INTRAMUSCULAR | Status: AC
  Filled 2015-08-23: qty 1

## 2015-08-23 MED ORDER — FENTANYL CITRATE (PF) 100 MCG/2ML IJ SOLN
INTRAMUSCULAR | Status: AC
  Filled 2015-08-23: qty 2

## 2015-08-23 MED ORDER — FENTANYL CITRATE (PF) 100 MCG/2ML IJ SOLN
INTRAMUSCULAR | Status: DC | PRN
  Administered 2015-08-23: 50 ug via INTRAVENOUS

## 2015-08-23 MED ORDER — DEXAMETHASONE SODIUM PHOSPHATE 4 MG/ML IJ SOLN
INTRAMUSCULAR | Status: AC
  Filled 2015-08-23: qty 1

## 2015-08-23 MED ORDER — BUPIVACAINE HCL (PF) 0.25 % IJ SOLN
INTRAMUSCULAR | Status: AC
  Filled 2015-08-23: qty 30

## 2015-08-23 MED ORDER — MIDAZOLAM HCL 2 MG/2ML IJ SOLN
INTRAMUSCULAR | Status: AC
  Filled 2015-08-23: qty 2

## 2015-08-23 MED ORDER — OXYCODONE-ACETAMINOPHEN 5-325 MG PO TABS: 1.0000 | ORAL_TABLET | ORAL | Status: DC | PRN

## 2015-08-23 MED ORDER — LIDOCAINE HCL (CARDIAC) 20 MG/ML IV SOLN
INTRAVENOUS | Status: DC | PRN
  Administered 2015-08-23: 100 mg via INTRAVENOUS

## 2015-08-23 MED ORDER — SODIUM CHLORIDE 0.9 % IJ SOLN
INTRAMUSCULAR | Status: DC | PRN
  Administered 2015-08-23: 50 mL

## 2015-08-23 MED ORDER — SODIUM CHLORIDE 0.9 % IJ SOLN
INTRAMUSCULAR | Status: AC
  Filled 2015-08-23: qty 50

## 2015-08-23 MED ORDER — PROPOFOL 10 MG/ML IV BOLUS
INTRAVENOUS | Status: AC
  Filled 2015-08-23: qty 20

## 2015-08-23 MED ORDER — FENTANYL CITRATE (PF) 100 MCG/2ML IJ SOLN: 25.0000 ug | INTRAMUSCULAR | Status: DC | PRN

## 2015-08-23 MED ORDER — MIDAZOLAM HCL 2 MG/2ML IJ SOLN
INTRAMUSCULAR | Status: DC | PRN
  Administered 2015-08-23: 2 mg via INTRAVENOUS

## 2015-08-23 MED ORDER — BUPIVACAINE HCL (PF) 0.25 % IJ SOLN
INTRAMUSCULAR | Status: DC | PRN
  Administered 2015-08-23: 20 mL

## 2015-08-23 SURGICAL SUPPLY — 21 items
ABLATOR ENDOMETRIAL BIPOLAR (ABLATOR) ×2 IMPLANT
CANISTER SUCT 3000ML (MISCELLANEOUS) ×2 IMPLANT
CATH ROBINSON RED A/P 16FR (CATHETERS) ×2 IMPLANT
CLOTH BEACON ORANGE TIMEOUT ST (SAFETY) ×2 IMPLANT
CONTAINER PREFILL 10% NBF 60ML (FORM) ×4 IMPLANT
DEVICE MYOSURE LITE (MISCELLANEOUS) IMPLANT
DEVICE MYOSURE REACH (MISCELLANEOUS) ×1 IMPLANT
FILTER ARTHROSCOPY CONVERTOR (FILTER) ×2 IMPLANT
GLOVE BIO SURGEON STRL SZ7.5 (GLOVE) ×2 IMPLANT
GLOVE BIOGEL PI IND STRL 7.0 (GLOVE) ×1 IMPLANT
GLOVE BIOGEL PI INDICATOR 7.0 (GLOVE) ×1
GOWN STRL REUS W/TWL LRG LVL3 (GOWN DISPOSABLE) ×6 IMPLANT
PACK VAGINAL MINOR WOMEN LF (CUSTOM PROCEDURE TRAY) ×2 IMPLANT
PAD OB MATERNITY 4.3X12.25 (PERSONAL CARE ITEMS) ×2 IMPLANT
PAD PREP 24X48 CUFFED NSTRL (MISCELLANEOUS) ×2 IMPLANT
SEAL ROD LENS SCOPE MYOSURE (ABLATOR) ×2 IMPLANT
SYR TB 1ML 25GX5/8 (SYRINGE) ×2 IMPLANT
TOWEL OR 17X24 6PK STRL BLUE (TOWEL DISPOSABLE) ×4 IMPLANT
TUBING AQUILEX INFLOW (TUBING) ×2 IMPLANT
TUBING AQUILEX OUTFLOW (TUBING) ×2 IMPLANT
WATER STERILE IRR 1000ML POUR (IV SOLUTION) ×2 IMPLANT

## 2015-08-23 NOTE — Discharge Instructions (Signed)

## 2015-08-23 NOTE — Progress Notes (Signed)
Patient ID: Beverly Munoz, female   DOB: 02/18/1960, 55 y.o.   MRN: [id number] Patient seen and examined. Consent witnessed and signed. No changes noted. Update completed.

## 2015-08-23 NOTE — OR Nursing (Signed)
FLUID DEFICIT= 725 UTERINE CAVITY WIDTH=4.3                                LENGTH=6.5 154 WATTS 1.22 SECONDS OF ABLATION TIME

## 2015-08-23 NOTE — Anesthesia Procedure Notes (Signed)
Procedure Name: LMA Insertion Date/Time: 08/23/2015 9:55 AM Performed by: Hewitt Blade Pre-anesthesia Checklist: Patient identified, Patient being monitored, Emergency Drugs available and Suction available Patient Re-evaluated:Patient Re-evaluated prior to inductionOxygen Delivery Method: Circle system utilized Preoxygenation: Pre-oxygenation with 100% oxygen Intubation Type: IV induction LMA: LMA inserted LMA Size: 4.0 Number of attempts: 1 Placement Confirmation: positive ETCO2 and breath sounds checked- equal and bilateral Tube secured with: Tape Dental Injury: Teeth and Oropharynx as per pre-operative assessment

## 2015-08-23 NOTE — Op Note (Signed)
08/23/2015  10:39 AM  PATIENT:  Beverly Munoz  55 y.o. female  PRE-OPERATIVE DIAGNOSIS:  Submucosal Fibroid, Menorrhagia  POST-OPERATIVE DIAGNOSIS:  SUBMUCOSAL FIBROID  ENDOMETRIAL POLYPS MENORRHAGIA  PROCEDURE:  Procedure(s): DILATATION & CURETTAGE/ HYSTEROSCOPY WITH MYOSURE RESECTION OF POLYP AND SM FIBROID HYSTEROSCOPY WITH NOVASURE EAB  SURGEON:  Surgeon(s): Brien Few, MD  ASSISTANTS: none   ANESTHESIA:   local and general  ESTIMATED BLOOD LOSS: * No blood loss amount entered *   DRAINS: none   LOCAL MEDICATIONS USED:  MARCAINE    and Amount: 20 ml  SPECIMEN:  Source of Specimen:  EMC AND FIBROID FRAGMENTS AND POLYPS  DISPOSITION OF SPECIMEN:  PATHOLOGY  COUNTS:  YES  DICTATION #: [id number]  PLAN OF CARE: DC HOME  PATIENT DISPOSITION:  PACU - hemodynamically stable.

## 2015-08-23 NOTE — Anesthesia Postprocedure Evaluation (Signed)
Anesthesia Post Note  Patient: Beverly Munoz  Procedure(s) Performed: Procedure(s) (LRB): DILATATION & CURETTAGE/HYSTEROSCOPY WITH MYOSURE (N/A) HYSTEROSCOPY WITH NOVASURE (N/A)  Patient location during evaluation: PACU Anesthesia Type: General Level of consciousness: awake and alert Pain management: pain level controlled Vital Signs Assessment: post-procedure vital signs reviewed and stable Respiratory status: spontaneous breathing, nonlabored ventilation, respiratory function stable and patient connected to nasal cannula oxygen Cardiovascular status: blood pressure returned to baseline and stable Postop Assessment: no signs of nausea or vomiting Anesthetic complications: no     Last Vitals:  Filed Vitals:   08/23/15 1130 08/23/15 1145  BP: 126/78   Pulse: 66 73  Temp:    Resp: 6 17    Last Pain:  Filed Vitals:   08/23/15 1151  PainSc: 3    Pain Goal: Patients Stated Pain Goal: 3 (08/23/15 1042)               Alexis Frock

## 2015-08-23 NOTE — Transfer of Care (Signed)
Immediate Anesthesia Transfer of Care Note  Patient: Beverly Munoz  Procedure(s) Performed: Procedure(s): DILATATION & CURETTAGE/HYSTEROSCOPY WITH MYOSURE (N/A) HYSTEROSCOPY WITH NOVASURE (N/A)  Patient Location: PACU  Anesthesia Type:General  Level of Consciousness: awake, alert  and oriented  Airway & Oxygen Therapy: Patient Spontanous Breathing and Patient connected to nasal cannula oxygen  Post-op Assessment: Report given to RN, Post -op Vital signs reviewed and stable and Patient moving all extremities  Post vital signs: Reviewed and stable  Last Vitals:  Filed Vitals:   08/23/15 0903  BP: 120/84  Pulse: 71  Temp: 36.9 C  Resp: 18    Last Pain: There were no vitals filed for this visit.    Patients Stated Pain Goal: 3 ([id number])  Complications: No apparent anesthesia complications

## 2015-08-24 NOTE — Op Note (Signed)
NAMEMIRAL, BREHENY NO.:  [id number]  MEDICAL RECORD NO.:  [id number]  LOCATION:  [facility]                          FACILITY:  [facility]  PHYSICIAN:  Lovenia Kim, M.D.DATE OF BIRTH:  1960-12-08  DATE OF PROCEDURE: DATE OF DISCHARGE:  08/23/2015                              OPERATIVE REPORT   PREOPERATIVE DIAGNOSIS:  Menometrorrhagia with uterine fibroids.  POSTOPERATIVE DIAGNOSIS:  Menometrorrhagia with uterine fibroids plus endometrial polyps.  PROCEDURE:  Diagnostic hysteroscopy, D and C, NovaSure endometrial ablation, MyoSure resection of submucous fibroids and endometrial polyps.  SURGEON:  Lovenia Kim, M.D.  ASSISTANT:  None.  ANESTHESIA:  General and local.  ESTIMATED BLOOD LOSS:  Less than 50 mL.  COMPLICATIONS:  None.  FLUID DEFICIT:  Per machine was calculated at 720.  There was a significant amount of fluid in the bag and on the floor, approximate fluid deficit of 500 mL noted.  DISPOSITION:  The patient tolerated the procedure well, was sent to Recovery in good condition.  DESCRIPTION OF PROCEDURE:  After being apprised of risks of anesthesia, infection, bleeding, injury to surrounding organs, possible need for repair, delayed versus immediate complications to include bowel and bladder injury, possible need for repair, possible inability to cure, abnormal bleeding and/or dysmenorrhea, the patient was brought to the operating room, was administered general anesthetic without complications, prepped and draped in usual sterile fashion, catheterized until the bladder was empty.  Exam under anesthesia revealed a bulky 10- 12 week size retroflexed uterus, and no adnexal masses appreciated.  At this time, a dilute Pitressin solution was placed at 3 o'clock and 9 o'clock in the standard location with a dilute Marcaine solution was placed and standard paracervical block 20 mL total.  Cervix was dilated up to a #27 Pratt dilator.   Hysteroscope placed.  Visualization revealed two sessile endometrial polyps along the left lateral portion and two right-sided submucous fibroids which are all resected with entry of the MyoSure device down to the level of endometrium.  At this time, the cavity appeared clear of any myometrial lesions.  D and C performed using sharp curettage.  Subsequent to this, the NovaSure device was placed, seated to a length of 6.5 and a width of 4.3 and initiated per standard protocol after a negative CO2 test at the end of the procedure which is 1 minute and 22 seconds.  The device was removed and inspected and found to be intact.  Revisualization of the endometrial cavity reveals no evidence of uterine perforation and a well-ablated endometrial cavity.  Good hemostasis was noted.  Fluid deficit was calculated as previously noted above.  The patient tolerated the procedure well, was awakened and transferred to Recovery in good condition.     Lovenia Kim, M.D.     RJT/MEDQ  D:  08/23/2015  T:  08/23/2015  Job:  [id number]

## 2015-08-30 ENCOUNTER — Encounter (HOSPITAL_COMMUNITY): Payer: Self-pay | Admitting: Obstetrics and Gynecology

## 2016-01-16 ENCOUNTER — Other Ambulatory Visit: Payer: Self-pay | Admitting: Obstetrics and Gynecology

## 2016-01-28 ENCOUNTER — Encounter (HOSPITAL_COMMUNITY): Payer: Self-pay

## 2016-01-28 ENCOUNTER — Encounter (HOSPITAL_COMMUNITY)
Admission: RE | Admit: 2016-01-28 | Discharge: 2016-01-28 | Disposition: A | Discharge status: Home / Self Care | Payer: Managed Care, Other (non HMO) | Source: Ambulatory Visit | Attending: Obstetrics and Gynecology | Admitting: Obstetrics and Gynecology

## 2016-01-28 DIAGNOSIS — Z01812 Encounter for preprocedural laboratory examination: Secondary | ICD-10-CM | POA: Insufficient documentation

## 2016-01-28 HISTORY — DX: Anemia, unspecified: D64.9

## 2016-01-28 LAB — BASIC METABOLIC PANEL
Anion gap: 7 (ref 5–15)
BUN: 9 mg/dL (ref 6–20)
CALCIUM: 8.7 mg/dL — AB (ref 8.9–10.3)
CO2: 26 mmol/L (ref 22–32)
CREATININE: 0.8 mg/dL (ref 0.44–1.00)
Chloride: 105 mmol/L (ref 101–111)
GFR calc Af Amer: >60 mL/min (ref >60)
GFR calc non Af Amer: >60 mL/min (ref >60)
GLUCOSE: 91 mg/dL (ref 65–99)
Potassium: 4.1 mmol/L (ref 3.5–5.1)
Sodium: 138 mmol/L (ref 135–145)

## 2016-01-28 LAB — CBC
HCT: 32.9 % — ABNORMAL LOW (ref 36.0–46.0)
Hemoglobin: 10.1 g/dL — ABNORMAL LOW (ref 12.0–15.0)
MCH: 23.2 pg — AB (ref 26.0–34.0)
MCHC: 30.7 g/dL (ref 30.0–36.0)
MCV: 75.6 fL — ABNORMAL LOW (ref 78.0–100.0)
PLATELETS: 317 10*3/uL (ref 150–400)
RBC: 4.35 MIL/uL (ref 3.87–5.11)
RDW: 17.4 % — ABNORMAL HIGH (ref 11.5–15.5)
WBC: 7.8 10*3/uL (ref 4.0–10.5)

## 2016-01-28 LAB — TYPE AND SCREEN
ABO/RH(D): O POS
Antibody Screen: NEGATIVE

## 2016-01-28 LAB — ABO/RH: ABO/RH(D): O POS

## 2016-01-28 NOTE — Patient Instructions (Addendum)
Your procedure is scheduled on: Monday, February 04, 2016 AT 12:45  Enter through the Main Entrance of [facility] at: 11:15  Pick up the phone at the desk and dial [phone].  Call this number if you have problems the morning of surgery: [phone].  Remember: Do NOT eat food: AFTER MIDNIGHT ON Sunday, December 17 Do NOT drink clear liquids after: Take these medicines the morning of surgery with a SIP OF WATER: NONE   MAY USE RESTASIS EYE DROPS MORNING OF SURGERY  Do NOT wear jewelry (body piercing), metal hair clips/bobby pins, make-up, or nail polish. Do NOT wear lotions, powders, or perfumes.  You may wear deoderant. Do NOT shave for 48 hours prior to surgery. Do NOT bring valuables to the hospital. Contacts, dentures, or bridgework may not be worn into surgery. Leave suitcase in car.  After surgery it may be brought to your room.  For patients admitted to the hospital, checkout time is 11:00 AM the day of discharge.

## 2016-02-04 ENCOUNTER — Ambulatory Visit (HOSPITAL_COMMUNITY): Payer: Managed Care, Other (non HMO) | Admitting: Anesthesiology

## 2016-02-04 ENCOUNTER — Ambulatory Visit (HOSPITAL_COMMUNITY)
Admission: RE | Admit: 2016-02-04 | Discharge: 2016-02-05 | Disposition: A | Discharge status: Home / Self Care | Payer: Managed Care, Other (non HMO) | Source: Ambulatory Visit | Attending: Obstetrics and Gynecology | Admitting: Obstetrics and Gynecology

## 2016-02-04 ENCOUNTER — Encounter (HOSPITAL_COMMUNITY): Payer: Self-pay | Admitting: Emergency Medicine

## 2016-02-04 ENCOUNTER — Encounter (HOSPITAL_COMMUNITY)
Admission: RE | Disposition: A | Discharge status: Home / Self Care | Payer: Self-pay | Source: Ambulatory Visit | Attending: Obstetrics and Gynecology

## 2016-02-04 DIAGNOSIS — K469 Unspecified abdominal hernia without obstruction or gangrene: Secondary | ICD-10-CM | POA: Insufficient documentation

## 2016-02-04 DIAGNOSIS — Z79899 Other long term (current) drug therapy: Secondary | ICD-10-CM | POA: Diagnosis not present

## 2016-02-04 DIAGNOSIS — Z6832 Body mass index (BMI) 32.0-32.9, adult: Secondary | ICD-10-CM | POA: Insufficient documentation

## 2016-02-04 DIAGNOSIS — Z803 Family history of malignant neoplasm of breast: Secondary | ICD-10-CM | POA: Diagnosis not present

## 2016-02-04 DIAGNOSIS — D259 Leiomyoma of uterus, unspecified: Secondary | ICD-10-CM | POA: Insufficient documentation

## 2016-02-04 DIAGNOSIS — N838 Other noninflammatory disorders of ovary, fallopian tube and broad ligament: Secondary | ICD-10-CM | POA: Diagnosis not present

## 2016-02-04 DIAGNOSIS — E669 Obesity, unspecified: Secondary | ICD-10-CM | POA: Insufficient documentation

## 2016-02-04 DIAGNOSIS — Z880 Allergy status to penicillin: Secondary | ICD-10-CM | POA: Diagnosis not present

## 2016-02-04 DIAGNOSIS — N800 Endometriosis of uterus: Secondary | ICD-10-CM | POA: Diagnosis not present

## 2016-02-04 DIAGNOSIS — Z8042 Family history of malignant neoplasm of prostate: Secondary | ICD-10-CM | POA: Diagnosis not present

## 2016-02-04 DIAGNOSIS — N8500 Endometrial hyperplasia, unspecified: Secondary | ICD-10-CM | POA: Insufficient documentation

## 2016-02-04 DIAGNOSIS — D219 Benign neoplasm of connective and other soft tissue, unspecified: Secondary | ICD-10-CM | POA: Diagnosis present

## 2016-02-04 HISTORY — PX: ROBOTIC ASSISTED TOTAL HYSTERECTOMY WITH SALPINGECTOMY: SHX6679

## 2016-02-04 SURGERY — ROBOTIC ASSISTED TOTAL HYSTERECTOMY WITH SALPINGECTOMY
Anesthesia: General | Site: Vagina | Laterality: Bilateral

## 2016-02-04 MED ORDER — SCOPOLAMINE 1 MG/3DAYS TD PT72
1.0000 | MEDICATED_PATCH | Freq: Once | TRANSDERMAL | Status: DC
  Administered 2016-02-04: 1.5 mg via TRANSDERMAL

## 2016-02-04 MED ORDER — HYDROMORPHONE HCL 1 MG/ML IJ SOLN
INTRAMUSCULAR | Status: AC
  Filled 2016-02-04: qty 1

## 2016-02-04 MED ORDER — FENTANYL CITRATE (PF) 250 MCG/5ML IJ SOLN
INTRAMUSCULAR | Status: AC
  Filled 2016-02-04: qty 5

## 2016-02-04 MED ORDER — ROCURONIUM BROMIDE 100 MG/10ML IV SOLN
INTRAVENOUS | Status: AC
  Filled 2016-02-04: qty 1

## 2016-02-04 MED ORDER — MIDAZOLAM HCL 2 MG/2ML IJ SOLN
INTRAMUSCULAR | Status: AC
  Filled 2016-02-04: qty 2

## 2016-02-04 MED ORDER — PROPOFOL 10 MG/ML IV BOLUS
INTRAVENOUS | Status: DC | PRN
Start: 2016-02-04 — End: 2016-02-04
  Administered 2016-02-04: 150 mg via INTRAVENOUS

## 2016-02-04 MED ORDER — DEXAMETHASONE SODIUM PHOSPHATE 10 MG/ML IJ SOLN
INTRAMUSCULAR | Status: DC | PRN
Start: 2016-02-04 — End: 2016-02-04
  Administered 2016-02-04: 10 mg via INTRAVENOUS

## 2016-02-04 MED ORDER — PROPOFOL 10 MG/ML IV BOLUS
INTRAVENOUS | Status: AC
  Filled 2016-02-04: qty 20

## 2016-02-04 MED ORDER — GLYCOPYRROLATE 0.2 MG/ML IJ SOLN
INTRAMUSCULAR | Status: AC
  Filled 2016-02-04: qty 3

## 2016-02-04 MED ORDER — ONDANSETRON HCL 4 MG/2ML IJ SOLN
INTRAMUSCULAR | Status: AC
  Filled 2016-02-04: qty 2

## 2016-02-04 MED ORDER — BUPIVACAINE HCL (PF) 0.25 % IJ SOLN
INTRAMUSCULAR | Status: AC
  Filled 2016-02-04: qty 30

## 2016-02-04 MED ORDER — SODIUM CHLORIDE 0.9 % IJ SOLN
INTRAMUSCULAR | Status: AC
  Filled 2016-02-04: qty 50

## 2016-02-04 MED ORDER — FENTANYL CITRATE (PF) 100 MCG/2ML IJ SOLN
INTRAMUSCULAR | Status: AC
  Filled 2016-02-04: qty 2

## 2016-02-04 MED ORDER — DIPHENHYDRAMINE HCL 50 MG/ML IJ SOLN: 12.5000 mg | Freq: Four times a day (QID) | INTRAMUSCULAR | Status: DC | PRN

## 2016-02-04 MED ORDER — SODIUM CHLORIDE 0.9 % IV SOLN
INTRAVENOUS | Status: DC | PRN
  Administered 2016-02-04: 60 mL

## 2016-02-04 MED ORDER — MIDAZOLAM HCL 2 MG/2ML IJ SOLN
INTRAMUSCULAR | Status: DC | PRN
  Administered 2016-02-04: 0.5 mg via INTRAVENOUS
  Administered 2016-02-04: 1.5 mg via INTRAVENOUS

## 2016-02-04 MED ORDER — ONDANSETRON HCL 4 MG/2ML IJ SOLN: 4.0000 mg | Freq: Four times a day (QID) | INTRAMUSCULAR | Status: DC | PRN

## 2016-02-04 MED ORDER — NALOXONE HCL 0.4 MG/ML IJ SOLN: 0.4000 mg | INTRAMUSCULAR | Status: DC | PRN

## 2016-02-04 MED ORDER — GLYCOPYRROLATE 0.2 MG/ML IJ SOLN
INTRAMUSCULAR | Status: DC | PRN
  Administered 2016-02-04 (×2): 0.1 mg via INTRAVENOUS

## 2016-02-04 MED ORDER — CEFAZOLIN SODIUM-DEXTROSE 2-4 GM/100ML-% IV SOLN
2.0000 g | INTRAVENOUS | Status: AC
  Administered 2016-02-04: 2 g via INTRAVENOUS

## 2016-02-04 MED ORDER — DEXAMETHASONE SODIUM PHOSPHATE 10 MG/ML IJ SOLN
INTRAMUSCULAR | Status: AC
  Filled 2016-02-04: qty 1

## 2016-02-04 MED ORDER — KETOROLAC TROMETHAMINE 30 MG/ML IJ SOLN
INTRAMUSCULAR | Status: AC
  Filled 2016-02-04: qty 1

## 2016-02-04 MED ORDER — LIDOCAINE HCL (CARDIAC) 20 MG/ML IV SOLN
INTRAVENOUS | Status: DC | PRN
Start: 2016-02-04 — End: 2016-02-04
  Administered 2016-02-04: 60 mg via INTRAVENOUS

## 2016-02-04 MED ORDER — PROMETHAZINE HCL 25 MG/ML IJ SOLN: 6.2500 mg | INTRAMUSCULAR | Status: DC | PRN

## 2016-02-04 MED ORDER — ROCURONIUM BROMIDE 100 MG/10ML IV SOLN
INTRAVENOUS | Status: DC | PRN
Start: 2016-02-04 — End: 2016-02-04
  Administered 2016-02-04: 10 mg via INTRAVENOUS
  Administered 2016-02-04: 50 mg via INTRAVENOUS

## 2016-02-04 MED ORDER — HYDROMORPHONE HCL 1 MG/ML IJ SOLN
INTRAMUSCULAR | Status: DC | PRN
  Administered 2016-02-04: 1 mg via INTRAVENOUS

## 2016-02-04 MED ORDER — OXYCODONE-ACETAMINOPHEN 5-325 MG PO TABS: 1.0000 | ORAL_TABLET | ORAL | Status: DC | PRN

## 2016-02-04 MED ORDER — FENTANYL CITRATE (PF) 100 MCG/2ML IJ SOLN
INTRAMUSCULAR | Status: DC | PRN
  Administered 2016-02-04 (×3): 50 ug via INTRAVENOUS
  Administered 2016-02-04: 100 ug via INTRAVENOUS

## 2016-02-04 MED ORDER — LIDOCAINE HCL (CARDIAC) 20 MG/ML IV SOLN
INTRAVENOUS | Status: AC
  Filled 2016-02-04: qty 5

## 2016-02-04 MED ORDER — SODIUM CHLORIDE 0.9% FLUSH: 9.0000 mL | INTRAVENOUS | Status: DC | PRN

## 2016-02-04 MED ORDER — BUPIVACAINE HCL (PF) 0.25 % IJ SOLN
INTRAMUSCULAR | Status: DC | PRN
  Administered 2016-02-04: 30 mL

## 2016-02-04 MED ORDER — HYDROMORPHONE 1 MG/ML IV SOLN
INTRAVENOUS | Status: DC
  Administered 2016-02-04: 0.2 mg via INTRAVENOUS
  Administered 2016-02-04: 19:00:00 via INTRAVENOUS
  Administered 2016-02-05: 0.8 mg via INTRAVENOUS
  Administered 2016-02-05: 0.2 mg via INTRAVENOUS
  Filled 2016-02-04: qty 25

## 2016-02-04 MED ORDER — TRAMADOL HCL 50 MG PO TABS: 50.0000 mg | ORAL_TABLET | Freq: Four times a day (QID) | ORAL | Status: DC | PRN

## 2016-02-04 MED ORDER — NEOSTIGMINE METHYLSULFATE 10 MG/10ML IV SOLN
INTRAVENOUS | Status: AC
  Filled 2016-02-04: qty 1

## 2016-02-04 MED ORDER — ROPIVACAINE HCL 5 MG/ML IJ SOLN
INTRAMUSCULAR | Status: AC
  Filled 2016-02-04: qty 30

## 2016-02-04 MED ORDER — ONDANSETRON HCL 4 MG/2ML IJ SOLN
INTRAMUSCULAR | Status: DC | PRN
  Administered 2016-02-04 (×2): 2 mg via INTRAVENOUS

## 2016-02-04 MED ORDER — HYDROMORPHONE HCL 1 MG/ML IJ SOLN: 0.2500 mg | INTRAMUSCULAR | Status: DC | PRN

## 2016-02-04 MED ORDER — LACTATED RINGERS IV SOLN
INTRAVENOUS | Status: DC
  Administered 2016-02-04 (×2): via INTRAVENOUS

## 2016-02-04 MED ORDER — SUGAMMADEX SODIUM 200 MG/2ML IV SOLN
INTRAVENOUS | Status: DC | PRN
  Administered 2016-02-04: 168.2 mg via INTRAVENOUS

## 2016-02-04 MED ORDER — SCOPOLAMINE 1 MG/3DAYS TD PT72
MEDICATED_PATCH | TRANSDERMAL | Status: AC
  Administered 2016-02-04: 1.5 mg via TRANSDERMAL
  Filled 2016-02-04: qty 1

## 2016-02-04 MED ORDER — DIPHENHYDRAMINE HCL 12.5 MG/5ML PO ELIX: 12.5000 mg | ORAL_SOLUTION | Freq: Four times a day (QID) | ORAL | Status: DC | PRN

## 2016-02-04 MED ORDER — DEXTROSE IN LACTATED RINGERS 5 % IV SOLN
INTRAVENOUS | Status: DC
  Administered 2016-02-04 – 2016-02-05 (×2): via INTRAVENOUS

## 2016-02-04 SURGICAL SUPPLY — 60 items
ADH SKN CLS APL DERMABOND .7 (GAUZE/BANDAGES/DRESSINGS) ×1
APPLICATOR COTTON TIP 6IN STRL (MISCELLANEOUS) ×2 IMPLANT
BARRIER ADHS 3X4 INTERCEED (GAUZE/BANDAGES/DRESSINGS) IMPLANT
BRR ADH 4X3 ABS CNTRL BYND (GAUZE/BANDAGES/DRESSINGS)
CATH FOLEY 3WAY  5CC 16FR (CATHETERS) ×1
CATH FOLEY 3WAY 5CC 16FR (CATHETERS) ×1 IMPLANT
CLOTH BEACON ORANGE TIMEOUT ST (SAFETY) ×2 IMPLANT
CONT PATH 16OZ SNAP LID 3702 (MISCELLANEOUS) ×2 IMPLANT
COVER BACK TABLE 60X90IN (DRAPES) ×4 IMPLANT
COVER LIGHT HANDLE  1/PK (MISCELLANEOUS) ×1
COVER LIGHT HANDLE 1/PK (MISCELLANEOUS) IMPLANT
COVER TIP SHEARS 8 DVNC (MISCELLANEOUS) ×1 IMPLANT
COVER TIP SHEARS 8MM DA VINCI (MISCELLANEOUS) ×1
DECANTER SPIKE VIAL GLASS SM (MISCELLANEOUS) ×6 IMPLANT
DERMABOND ADVANCED (GAUZE/BANDAGES/DRESSINGS) ×1
DERMABOND ADVANCED .7 DNX12 (GAUZE/BANDAGES/DRESSINGS) ×1 IMPLANT
DRSG TEGADERM 4X4.75 (GAUZE/BANDAGES/DRESSINGS) ×1 IMPLANT
DURAPREP 26ML APPLICATOR (WOUND CARE) ×2 IMPLANT
ELECT REM PT RETURN 9FT ADLT (ELECTROSURGICAL) ×2
ELECTRODE REM PT RTRN 9FT ADLT (ELECTROSURGICAL) ×1 IMPLANT
GAUZE VASELINE 3X9 (GAUZE/BANDAGES/DRESSINGS) IMPLANT
GLOVE BIO SURGEON STRL SZ7.5 (GLOVE) ×6 IMPLANT
GLOVE BIOGEL PI IND STRL 7.0 (GLOVE) ×2 IMPLANT
GLOVE BIOGEL PI INDICATOR 7.0 (GLOVE) ×2
GLOVE ECLIPSE 6.0 STRL STRAW (GLOVE) ×3 IMPLANT
GLOVE ECLIPSE 6.5 STRL STRAW (GLOVE) ×3 IMPLANT
KIT ACCESSORY DA VINCI DISP (KITS) ×1
KIT ACCESSORY DVNC DISP (KITS) ×1 IMPLANT
LEGGING LITHOTOMY PAIR STRL (DRAPES) ×2 IMPLANT
NEEDLE INSUFFLATION 150MM (ENDOMECHANICALS) ×2 IMPLANT
OCCLUDER COLPOPNEUMO (BALLOONS) ×2 IMPLANT
PACK ROBOT WH (CUSTOM PROCEDURE TRAY) ×2 IMPLANT
PACK ROBOTIC GOWN (GOWN DISPOSABLE) ×2 IMPLANT
PACK TRENDGUARD 450 HYBRID PRO (MISCELLANEOUS) IMPLANT
PAD PREP 24X48 CUFFED NSTRL (MISCELLANEOUS) ×2 IMPLANT
POUCH LAPAROSCOPIC INSTRUMENT (MISCELLANEOUS) ×1 IMPLANT
PROTECTOR NERVE ULNAR (MISCELLANEOUS) ×4 IMPLANT
SET CYSTO W/LG BORE CLAMP LF (SET/KITS/TRAYS/PACK) IMPLANT
SET IRRIG TUBING LAPAROSCOPIC (IRRIGATION / IRRIGATOR) ×2 IMPLANT
SET TRI-LUMEN FLTR TB AIRSEAL (TUBING) ×2 IMPLANT
SUT VIC AB 0 CT1 27 (SUTURE) ×4
SUT VIC AB 0 CT1 27XBRD ANBCTR (SUTURE) ×2 IMPLANT
SUT VICRYL 0 UR6 27IN ABS (SUTURE) ×2 IMPLANT
SUT VICRYL RAPIDE 4/0 PS 2 (SUTURE) ×4 IMPLANT
SUT VLOC 180 0 9IN  GS21 (SUTURE) ×3
SUT VLOC 180 0 9IN GS21 (SUTURE) IMPLANT
SYR 50ML LL SCALE MARK (SYRINGE) ×2 IMPLANT
SYRINGE 10CC LL (SYRINGE) ×2 IMPLANT
TIP RUMI ORANGE 6.7MMX12CM (TIP) IMPLANT
TIP UTERINE 5.1X6CM LAV DISP (MISCELLANEOUS) IMPLANT
TIP UTERINE 6.7X10CM GRN DISP (MISCELLANEOUS) IMPLANT
TIP UTERINE 6.7X6CM WHT DISP (MISCELLANEOUS) IMPLANT
TIP UTERINE 6.7X8CM BLUE DISP (MISCELLANEOUS) IMPLANT
TOWEL OR 17X24 6PK STRL BLUE (TOWEL DISPOSABLE) ×6 IMPLANT
TRENDGUARD 450 HYBRID PRO PACK (MISCELLANEOUS) ×2
TROCAR DISP BLADELESS 8 DVNC (TROCAR) ×1 IMPLANT
TROCAR DISP BLADELESS 8MM (TROCAR) ×1
TROCAR PORT AIRSEAL 5X120 (TROCAR) ×2 IMPLANT
TROCAR Z-THREAD 12X150 (TROCAR) ×2 IMPLANT
WATER STERILE IRR 1000ML POUR (IV SOLUTION) ×2 IMPLANT

## 2016-02-04 NOTE — Op Note (Signed)
02/04/2016  4:20 PM  PATIENT:  Beverly Munoz  55 y.o. 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  PROCEDURE:  Procedure(s): ROBOTIC ASSISTED TOTAL HYSTERECTOMY WITH BILATERAL SALPINGECTOMY VAGINAL MORCELLATION WITH REMOVAL OF SPECIMEN LYSIS OF SIGMOID TO LEFT ADNEXAL ADHESIONS RIGHT OVARIAN CYSTECTOMY LEFT OVARIAN CYSTECTOMY MCCALL CUL DE PLASTY   SURGEON:  Surgeon(s): Brien Few, MD  ASSISTANTS: DAWSON, CNM   ANESTHESIA:   local and general  ESTIMATED BLOOD LOSS: 100  DRAINS: Urinary Catheter (Foley)   LOCAL MEDICATIONS USED:  MARCAINE    and Amount: 30 ml  SPECIMEN:  Source of Specimen:  UTERUS , CERVIX AND BILATERAL TUBES  DISPOSITION OF SPECIMEN:  PATHOLOGY  COUNTS:  YES   DICTATION #: [id number]  PLAN OF CARE: 23 HR OBSERVATION  PATIENT DISPOSITION:  PACU - hemodynamically stable.

## 2016-02-04 NOTE — H&P (Signed)
NAME:  Beverly Munoz, Beverly Munoz                       ACCOUNT NO.:  MEDICAL RECORD NO.:  [id number]  LOCATION:                                 FACILITY:  PHYSICIAN:  Lovenia Kim, M.D.DATE OF BIRTH:  02-Jan-1961  DATE OF ADMISSION: DATE OF DISCHARGE:                             HISTORY & PHYSICAL   CHIEF COMPLAINT:  Failed endometrial ablation with irregular bleeding, desired for definitive therapy and symptomatic fibroids  HISTORY OF PRESENT ILLNESS:  A 55 year old white female, G5, P1 who presents now with aforementioned indications for definitive therapy of proceeding with total laparoscopic hysterectomy, bilateral salpingectomy today.  ALLERGIES:  SHE HAS ALLERGIES TO PENICILLIN.  FAMILY HISTORY:  She has a family history of breast and prostate cancer.  MEDICATIONS: 1. Restasis. 2. Ambien.  SOCIAL HISTORY:  She is nonsmoker, nondrinker.  Denies domestic or physical violence.  PAST SURGICAL HISTORY: 1. Myomectomy. 2. Hysteroscopy. 3. NovaSure ablation. 4. Breast lumpectomy.  PHYSICAL EXAMINATION:  GENERAL:  She is a well-developed, well-nourished white female, in no acute distress. HEENT:  Normal. NECK:  Supple.  Full range of motion. LUNGS:  Clear. HEART:  Regular rate and rhythm. ABDOMEN:  Soft, nontender. GENITOURINARY:  Uterus about 10 to 12 weeks size. EXTREMITIES:  There are no cords. NEUROLOGIC:  Nonfocal. SKIN:  Intact.  IMPRESSION AND PLAN:  Symptomatic fibroids, failed endometrial ablation and da Vinci total laparoscopic hysterectomy, bilateral salpingectomy. Risks of anesthesia, infection, bleeding, injury to surrounding organs, possible need for repair was discussed.  Delayed versus immediate complications include bowel and bladder injury are noted.  The patient acknowledges and wishes to proceed.     Lovenia Kim, M.D.     RJT/MEDQ  D:  02/04/2016  T:  02/04/2016  Job:  [id number]

## 2016-02-04 NOTE — Anesthesia Preprocedure Evaluation (Signed)
Anesthesia Evaluation  Patient identified by MRN, date of birth, ID band Patient awake    Reviewed: Allergy & Precautions, NPO status , Patient's Chart, lab work & pertinent test results  Airway Mallampati: II   Neck ROM: Full    Dental  (+) Teeth Intact, Dental Advisory Given   Pulmonary neg pulmonary ROS,    breath sounds clear to auscultation       Cardiovascular negative cardio ROS   Rhythm:Regular  CATH 2014 EF 55%, normal coronaries     Neuro/Psych negative neurological ROS  negative psych ROS   GI/Hepatic negative GI ROS, Neg liver ROS,   Endo/Other  Obesity, BMI 32  Renal/GU negative Renal ROS  negative genitourinary   Musculoskeletal  (+) Arthritis ,   Abdominal   Peds negative pediatric ROS (+)  Hematology negative hematology ROS (+)   Anesthesia Other Findings   Reproductive/Obstetrics negative OB ROS                             Anesthesia Physical  Anesthesia Plan  ASA: II  Anesthesia Plan: General   Post-op Pain Management:    Induction: Intravenous  Airway Management Planned: Oral ETT  Additional Equipment:   Intra-op Plan:   Post-operative Plan: Extubation in OR  Informed Consent: I have reviewed the patients History and Physical, chart, labs and discussed the procedure including the risks, benefits and alternatives for the proposed anesthesia with the patient or authorized representative who has indicated his/her understanding and acceptance.     Plan Discussed with:   Anesthesia Plan Comments: ( negative cardiac WU including CATH in 2014, treated for lower extremity swelling)        Anesthesia Quick Evaluation

## 2016-02-04 NOTE — Transfer of Care (Signed)
Immediate Anesthesia Transfer of Care Note  Patient: Beverly Munoz  Procedure(s) Performed: Procedure(s) with comments: ROBOTIC ASSISTED TOTAL HYSTERECTOMY WITH SALPINGECTOMY/Possible Bilateral Salpingo-Oophorectomy (Bilateral) - 2 1/2 hrs. requested  Patient Location: PACU  Anesthesia Type:General  Level of Consciousness: awake, alert  and oriented  Airway & Oxygen Therapy: Patient Spontanous Breathing and Patient connected to nasal cannula oxygen  Post-op Assessment: Report given to RN and Post -op Vital signs reviewed and stable  Post vital signs: Reviewed and stable  Last Vitals:  Vitals:   02/04/16 1121  BP: 119/80  Pulse: 76  Resp: 15  Temp: 36.8 C    Last Pain:  Vitals:   02/04/16 1121  TempSrc: Oral      Patients Stated Pain Goal: 4 ([id number] [phone])  Complications: No apparent anesthesia complications

## 2016-02-04 NOTE — Progress Notes (Signed)
Patient seen and examined. Consent witnessed and signed. No changes noted. Update completed.Patient ID: Beverly Munoz, female   DOB: 1960-04-25, 55 y.o.   MRN: [id number]

## 2016-02-04 NOTE — Anesthesia Procedure Notes (Signed)
Procedure Name: Intubation Date/Time: 02/04/2016 1:08 PM Performed by: Duane Boston Pre-anesthesia Checklist: Patient identified, Patient being monitored, Timeout performed, Emergency Drugs available and Suction available Patient Re-evaluated:Patient Re-evaluated prior to inductionOxygen Delivery Method: Circle System Utilized Preoxygenation: Pre-oxygenation with 100% oxygen Intubation Type: IV induction Ventilation: Mask ventilation without difficulty Laryngoscope Size: Mac, Sabra Heck and 2 Grade View: Grade II Tube type: Oral Tube size: 7.0 mm Number of attempts: 1 Airway Equipment and Method: stylet Placement Confirmation: ETT inserted through vocal cords under direct vision,  positive ETCO2 and breath sounds checked- equal and bilateral Secured at: 21 cm Tube secured with: Tape Dental Injury: Teeth and Oropharynx as per pre-operative assessment

## 2016-02-05 DIAGNOSIS — D259 Leiomyoma of uterus, unspecified: Secondary | ICD-10-CM | POA: Diagnosis not present

## 2016-02-05 LAB — BASIC METABOLIC PANEL
ANION GAP: 5 (ref 5–15)
BUN: 8 mg/dL (ref 6–20)
CALCIUM: 8 mg/dL — AB (ref 8.9–10.3)
CHLORIDE: 104 mmol/L (ref 101–111)
CO2: 26 mmol/L (ref 22–32)
Creatinine, Ser: 0.89 mg/dL (ref 0.44–1.00)
GFR calc non Af Amer: >60 mL/min (ref >60)
Glucose, Bld: 138 mg/dL — ABNORMAL HIGH (ref 65–99)
Potassium: 4.1 mmol/L (ref 3.5–5.1)
SODIUM: 135 mmol/L (ref 135–145)

## 2016-02-05 LAB — CBC
HCT: 31.5 % — ABNORMAL LOW (ref 36.0–46.0)
HEMOGLOBIN: 9.8 g/dL — AB (ref 12.0–15.0)
MCH: 23.6 pg — AB (ref 26.0–34.0)
MCHC: 31.1 g/dL (ref 30.0–36.0)
MCV: 75.9 fL — ABNORMAL LOW (ref 78.0–100.0)
PLATELETS: 318 10*3/uL (ref 150–400)
RBC: 4.15 MIL/uL (ref 3.87–5.11)
RDW: 17.3 % — ABNORMAL HIGH (ref 11.5–15.5)
WBC: 11.5 10*3/uL — AB (ref 4.0–10.5)

## 2016-02-05 MED ORDER — TRAMADOL HCL 50 MG PO TABS: 50.0000 mg | ORAL_TABLET | Freq: Four times a day (QID) | ORAL | 0 refills | Status: DC | PRN

## 2016-02-05 MED ORDER — OXYCODONE-ACETAMINOPHEN 5-325 MG PO TABS: 1.0000 | ORAL_TABLET | ORAL | 0 refills | Status: DC | PRN

## 2016-02-05 NOTE — Anesthesia Postprocedure Evaluation (Signed)
Anesthesia Post Note  Patient: Beverly Munoz  Procedure(s) Performed: Procedure(s) (LRB): ROBOTIC ASSISTED TOTAL HYSTERECTOMY WITH SALPINGECTOMY/Possible Bilateral Salpingo-Oophorectomy (Bilateral)  Patient location during evaluation: [facility] Anesthesia Type: General Level of consciousness: awake and alert Pain management: pain level controlled Vital Signs Assessment: post-procedure vital signs reviewed and stable Respiratory status: spontaneous breathing, nonlabored ventilation and respiratory function stable Cardiovascular status: blood pressure returned to baseline and stable Postop Assessment: no signs of nausea or vomiting Anesthetic complications: no        Last Vitals:  Vitals:   02/05/16 0535 02/05/16 0548  BP: (!) 101/57   Pulse: 74   Resp: 18 18  Temp: 36.7 C     Last Pain:  Vitals:   02/05/16 0730  TempSrc:   PainSc: 0-No pain   Pain Goal: Patients Stated Pain Goal: 3 (02/05/16 0730)               Riki Sheer

## 2016-02-05 NOTE — Progress Notes (Signed)
1 Day Post-Op Procedure(s) (LRB): ROBOTIC ASSISTED TOTAL HYSTERECTOMY WITH SALPINGECTOMY/Possible Bilateral Salpingo-Oophorectomy (Bilateral)  Subjective: Patient reports nausea, incisional pain, tolerating PO, + flatus and no problems voiding.    Objective: BP (!) 101/57 (BP Location: Right Arm)   Pulse 74   Temp 98.1 F (36.7 C) (Oral)   Resp 18   Ht 5' 7.01" (1.702 m)   Wt 83.9 kg (185 lb)   LMP 02/03/2016   SpO2 97%   BMI 28.97 kg/m   I have reviewed patient's vital signs, intake and output, medications and labs. CBC    Component Value Date/Time   WBC 11.5 (H) 02/05/2016 0521   RBC 4.15 02/05/2016 0521   HGB 9.8 (L) 02/05/2016 0521   HCT 31.5 (L) 02/05/2016 0521   PLT 318 02/05/2016 0521   MCV 75.9 (L) 02/05/2016 0521   MCH 23.6 (L) 02/05/2016 0521   MCHC 31.1 02/05/2016 0521   RDW 17.3 (H) 02/05/2016 0521   LYMPHSABS 1.8 12/31/2012 0827   MONOABS 0.2 12/31/2012 0827   EOSABS 0.2 12/31/2012 0827   BASOSABS 0.0 12/31/2012 0827     General: alert, cooperative and appears stated age Resp: clear to auscultation bilaterally and normal percussion bilaterally Cardio: regular rate and rhythm, S1, S2 normal, no murmur, click, rub or gallop GI: soft, non-tender; bowel sounds normal; no masses,  no organomegaly and incision: clean, dry and intact Extremities: extremities normal, atraumatic, no cyanosis or edema and Homans sign is negative, no sign of DVT Vaginal Bleeding: minimal  Assessment: s/p Procedure(s) with comments: ROBOTIC ASSISTED TOTAL HYSTERECTOMY WITH SALPINGECTOMY/Possible Bilateral Salpingo-Oophorectomy (Bilateral) - 2 1/2 hrs. requested: stable, progressing well and tolerating diet  Plan: Advance diet Encourage ambulation Advance to PO medication Discontinue IV fluids Discharge home  LOS: 0 days    Itxel Wickard J 02/05/2016, 6:55 AM

## 2016-02-05 NOTE — Op Note (Signed)
Beverly Munoz, BITTING NO.:  [id number]  MEDICAL RECORD NO.:  [id number]  LOCATION:                                 FACILITY:  PHYSICIAN:  Lovenia Kim, M.D.DATE OF BIRTH:  10-30-1960  DATE OF PROCEDURE: DATE OF DISCHARGE:                              OPERATIVE REPORT   PREOPERATIVE DIAGNOSES:  Symptomatic uterine fibroids, failed endometrial ablation, focal simple hyperplasia without atypia.  POSTOPERATIVE DIAGNOSES:  Symptomatic uterine fibroids, failed endometrial ablation, focal simple hyperplasia without atypia, plus pelvic adhesions, plus enterocele.  PROCEDURES:  Da Vinci assisted total laparoscopic hysterectomy, bilateral salpingectomy, McCall culdoplasty, lysis of left adnexal to sigmoid adhesions, left ovarian cystectomy, and right ovarian cystectomy.  SURGEON:  Lovenia Kim, M.D.  ASSISTANT:  Renato Battles, CNM.  ESTIMATED BLOOD LOSS:  100 mL.  COMPLICATIONS:  None.  DRAINS:  Foley.  COUNTS:  Correct.  DISPOSITION:  The patient to recovery in good condition.  FINDINGS:  Simple cyst in right and left ovary, markedly enlarged 400 g uterus with multiple fibroids, and left adnexal adhesions.  DESCRIPTION OF PROCEDURE:  After being apprised of the risks of anesthesia, infection, bleeding, injury to surrounding organs, possible need for repair, delayed versus immediate complications to include bowel and bladder injury, possible need for repair, the patient was brought to the operating room where she was administered general anesthetic without complications.  Prepped and draped in usual sterile fashion.  Foley catheter placed.  After achieving adequate anesthesia, she was prepped and draped in usual sterile fashion.  A Foley catheter was placed.  Exam under anesthesia revealed a 12-14 weeks size uterus and no adnexal masses are appreciated.  Uterus has good mobility.  At this time, RUMI retractor placed vaginally without difficulty.  Due  to the enlarged nature of the specimen, a supraumbilical incision was made.  Veress needle placed, opening pressure of 1 noted 4 L of CO2 test without difficulty.  Trocar was placed atraumatically.  Visualization reveals as noted a markedly enlarged uterus, clear posterior cul-de-sac with left adnexal adhesions to the sigmoid colon.  The anterior cul-de-sac appeared clear except for some bladder adhesions due to previous cesarean section.  Both ovaries are slightly enlarged with what appeared to be simple cysts as noted and normal tubes.  At this time, two ports were placed on the left and right and two robotic on the right and one robotic and one assistant port on the left, AirSeal was initiated.  All of these done under direct visualization.  Trendelenburg was established.  The robot was docked in a standard fashion on the right side and PK forceps progressed and Endo Shears were placed.  At this time, the left adnexa was visualized, sigmoid adhesions were lysed sharply removing them from the left adnexa.  At this time, the mesosalpinx and left tube was undermined and divided.  The retroperitoneal space was entered on the left cephalad to this area and along the course posterior to the left round ligament.  The tubo-ovarian ligament was developed on the left and the tube and ovary are detached on that side leaving the ovary on the left.  The round ligament was opened.  The retroperitoneal space was further entered and the ureters seen peristalsing normally along the left pelvic sidewall.  Bladder flap was then developed sharply.  The left uterine artery was skeletonized, cauterized, but not cut.  The left ovarian cyst was excised, clear fluid without difficulty.  Right ovarian cyst then excised under sharp dissection without difficulty.  The left mesosalpinx was cauterized using monopolar cautery and divided.  The retroperitoneal space entered similarly on the right distal to the round  ligament.  The ureters identified peristalsing along the right pelvic sidewall.  The round ligament was opened.  The bladder flap was further developed sharply to the level of the RUMI cup.  The right uterine vessels were skeletonized, cauterized, and cut.  The left uterine vessels were then cauterized further and cut.  The bladder flap was sharply developed and finished. The specimen was detached from the cervicovaginal junction circumferentially 360 degrees with the monopolar cautery.  At this time, the specimen was bivalved superior to the fundus and removed vaginally with the help of vaginal morcellation.  Good hemostasis was noted.  No fibroid fragments were noted.  Irrigation was accomplished.  The vaginal cuff was closed in 2 running layers of 0 V-Loc suture.  McCall culdoplasty suture placed.  Good hemostasis was noted.  Irrigation was accomplished.  All instruments were then removed under direct visualization.  CO2 was released under positive pressure.  Incisions were closed using 0 Vicryl, 4-0 Vicryl.  Urine was clear.  Vaginal exam reveals a well-approximated vaginal cuff.  The patient tolerated the procedure well, was awakened, and transferred to recovery in good condition.     Lovenia Kim, M.D.     RJT/MEDQ  D:  02/04/2016  T:  02/05/2016  Job:  [id number]

## 2016-02-05 NOTE — Progress Notes (Signed)
Discharge teaching complete. Pt understood all instructions and did not have any questions. Pt ambulated out of the hospital and discharged home to family.  

## 2016-02-06 ENCOUNTER — Encounter (HOSPITAL_COMMUNITY): Payer: Self-pay | Admitting: Obstetrics and Gynecology

## 2017-07-30 ENCOUNTER — Other Ambulatory Visit: Payer: Self-pay | Admitting: Obstetrics and Gynecology

## 2017-07-30 DIAGNOSIS — E2839 Other primary ovarian failure: Secondary | ICD-10-CM

## 2017-09-14 ENCOUNTER — Ambulatory Visit
Admission: RE | Admit: 2017-09-14 | Discharge: 2017-09-14 | Disposition: A | Discharge status: Home / Self Care | Payer: BLUE CROSS/BLUE SHIELD | Source: Ambulatory Visit | Attending: Obstetrics and Gynecology | Admitting: Obstetrics and Gynecology

## 2017-09-14 DIAGNOSIS — E2839 Other primary ovarian failure: Secondary | ICD-10-CM

## 2019-08-31 ENCOUNTER — Other Ambulatory Visit: Payer: Self-pay | Admitting: Obstetrics and Gynecology

## 2019-08-31 DIAGNOSIS — N6452 Nipple discharge: Secondary | ICD-10-CM

## 2019-09-07 ENCOUNTER — Other Ambulatory Visit: Payer: Self-pay | Admitting: Obstetrics and Gynecology

## 2019-09-07 ENCOUNTER — Ambulatory Visit
Admission: RE | Admit: 2019-09-07 | Discharge: 2019-09-07 | Disposition: A | Discharge status: Home / Self Care | Payer: BLUE CROSS/BLUE SHIELD | Source: Ambulatory Visit | Attending: Obstetrics and Gynecology | Admitting: Obstetrics and Gynecology

## 2019-09-07 ENCOUNTER — Ambulatory Visit
Admission: RE | Admit: 2019-09-07 | Discharge: 2019-09-07 | Disposition: A | Discharge status: Home / Self Care | Payer: 59 | Source: Ambulatory Visit | Attending: Obstetrics and Gynecology | Admitting: Obstetrics and Gynecology

## 2019-09-07 ENCOUNTER — Other Ambulatory Visit: Payer: Self-pay

## 2019-09-07 DIAGNOSIS — N6452 Nipple discharge: Secondary | ICD-10-CM

## 2019-09-07 DIAGNOSIS — N6320 Unspecified lump in the left breast, unspecified quadrant: Secondary | ICD-10-CM

## 2019-09-15 ENCOUNTER — Ambulatory Visit
Admission: RE | Admit: 2019-09-15 | Discharge: 2019-09-15 | Disposition: A | Discharge status: Home / Self Care | Payer: 59 | Source: Ambulatory Visit | Attending: Obstetrics and Gynecology | Admitting: Obstetrics and Gynecology

## 2019-09-15 ENCOUNTER — Other Ambulatory Visit: Payer: Self-pay | Admitting: Obstetrics and Gynecology

## 2019-09-15 ENCOUNTER — Other Ambulatory Visit: Payer: Self-pay

## 2019-09-15 DIAGNOSIS — N6320 Unspecified lump in the left breast, unspecified quadrant: Secondary | ICD-10-CM

## 2019-10-14 ENCOUNTER — Ambulatory Visit: Payer: Self-pay | Admitting: Surgery

## 2019-10-14 DIAGNOSIS — D242 Benign neoplasm of left breast: Secondary | ICD-10-CM

## 2019-10-14 NOTE — H&P (Signed)
Beverly Munoz Appointment: 10/14/2019 9:20 AM Location: [facility] Patient #: [id number] DOB: Oct 18, 1960 Divorced / Language: Beverly Munoz / Race: White Female  History of Present Illness Beverly Moores A. Beverly Musquiz MD; 10/14/2019 12:34 PM) Patient words: Patient presents for evaluation of intermittent yellowish and clear discharge from the left nipple over the last 3 weeks. Patient states that the discharge is both spontaneous and with expression. Discharge is present for a few months. It is primarily clear MOC from her left breast without any indication it comes from the right. Mammogram and ultrasound core biopsy showed a left breast papilloma was biopsied. She's had no further drainage since the biopsy was Munoz. Her mother had breast cancer in her 35s. No other gynecological or other malignancies noted  EXAM: DIGITAL DIAGNOSTIC LEFT MAMMOGRAM WITH CAD AND TOMO  ULTRASOUND LEFT BREAST  COMPARISON: Previous exam(s).  ACR Breast Density Category b: There are scattered areas of fibroglandular density.  FINDINGS: Within the outer aspect of the left breast middle depth there is a small asymmetry. No additional masses, calcifications or nonsurgical distortion identified within the left breast. Postsurgical changes of the superior left breast on the MLO view.  Mammographic images were processed with CAD.  On physical exam, small amount of clear discharge is elicited with palpation.  Targeted ultrasound is performed, showing a mildly dilated duct within the retroareolar left breast 3 o'clock position. Within the more peripheral aspect of the duct there is a 19 x 2 x 7 mm intraductal mass left breast 3 o'clock position 5 cm from nipple. This likely corresponds with the asymmetry identified in the outer left breast on mammography.  No left axillary adenopathy.  IMPRESSION: Intraductal mass within the left breast 3 o'clock position. This likely corresponds with the asymmetry  identified within the outer left breast on mammography.  RECOMMENDATION: Ultrasound-guided core needle biopsy intraductal mass left breast.  I have discussed the findings and recommendations with the patient. If applicable, a reminder letter will be sent to the patient regarding the next appointment.  BI-RADS CATEGORY 4: Suspicious.   Electronically Signed By: Beverly Munoz M.D. On: 09/07/2019 10:00             Diagnosis Breast, left, needle core biopsy, 3 o'clock - INTRADUCTAL PAPILLOMA WITH ATYPICAL DUCTAL HYPERPLASIA. Microscopic Comment Dr. Jeannie Munoz has reviewed the case. The case was called to [facility] Munoz on 09/16/19. Beverly Males MD Pathologist, Electronic Signature (Case signed 09/16/2019) Specimen Gross and Clinical Information.  The patient is a 59 year old female.   Past Surgical History Beverly Munoz, [place]; 10/14/2019 9:25 AM) Breast Biopsy Bilateral. Cesarean Section - 1 Colon Polyp Removal - Colonoscopy Hysterectomy (not due to cancer) - Partial Oral Surgery Tonsillectomy  Diagnostic Studies History Beverly Munoz, [place]; 10/14/2019 9:25 AM) Colonoscopy 1-5 years ago Mammogram within last year Pap Smear 1-5 years ago  Allergies Beverly Munoz, RMA; 10/14/2019 9:26 AM) Penicillin G Pot in Dextrose *PENICILLINS* Allergies Reconciled  Medication History Beverly Munoz, RMA; 10/14/2019 9:28 AM) Fish Oil (1200MG  Capsule, Oral) Active. Multivitamin (Oral) Active. QC Tumeric Complex (500MG  Capsule, Oral) Active. Medications Reconciled  Pregnancy / Birth History Beverly Munoz, [place]; 10/14/2019 9:25 AM) Age at menarche 40 years. Gravida 4 Length (months) of breastfeeding 3-6 Maternal age 11-30 Para 2  Other Problems Beverly Munoz, [place]; 10/14/2019 9:25 AM) No pertinent past medical history     Review of Systems Beverly Munoz RMA; 10/14/2019 9:25 AM) General Not Present- Appetite Loss, Chills, Fatigue,  Fever, Night Sweats, Weight Gain and  Weight Loss. Skin Not Present- Change in Wart/Mole, Dryness, Hives, Jaundice, New Lesions, Non-Healing Wounds, Rash and Ulcer. HEENT Not Present- Earache, Hearing Loss, Hoarseness, Nose Bleed, Oral Ulcers, Ringing in the Ears, Seasonal Allergies, Sinus Pain, Sore Throat, Visual Disturbances, Wears glasses/contact lenses and Yellow Eyes. Respiratory Not Present- Bloody sputum, Chronic Cough, Difficulty Breathing, Snoring and Wheezing. Breast Present- Nipple Discharge. Not Present- Breast Mass, Breast Pain and Skin Changes. Cardiovascular Not Present- Chest Pain, Difficulty Breathing Lying Down, Leg Cramps, Palpitations, Rapid Heart Rate, Shortness of Breath and Swelling of Extremities. Gastrointestinal Not Present- Abdominal Pain, Bloating, Bloody Stool, Change in Bowel Habits, Chronic diarrhea, Constipation, Difficulty Swallowing, Excessive gas, Gets full quickly at meals, Hemorrhoids, Indigestion, Nausea, Rectal Pain and Vomiting. Female Genitourinary Not Present- Frequency, Nocturia, Painful Urination, Pelvic Pain and Urgency. Musculoskeletal Not Present- Back Pain, Joint Pain, Joint Stiffness, Muscle Pain, Muscle Weakness and Swelling of Extremities. Neurological Not Present- Decreased Memory, Fainting, Headaches, Numbness, Seizures, Tingling, Tremor, Trouble walking and Weakness. Psychiatric Not Present- Anxiety, Bipolar, Change in Sleep Pattern, Depression, Fearful and Frequent crying. Endocrine Present- Hot flashes. Not Present- Cold Intolerance, Excessive Hunger, Hair Changes, Heat Intolerance and New Diabetes. Hematology Not Present- Blood Thinners, Easy Bruising, Excessive bleeding, Gland problems, HIV and Persistent Infections.  Vitals Lattie Haw Branford RMA; 10/14/2019 9:28 AM) 10/14/2019 9:28 AM Weight: 185 lb Height: 67in Body Surface Area: 1.96 m Body Mass Index: 28.97 kg/m  Temp.: 97.27F  Pulse: 77 (Regular)  P.OX: 99% (Room air) BP:  114/70(Sitting, Left Arm, Standard)        Physical Exam (Talen Poser A. Whittany Parish MD; 10/14/2019 12:35 PM)  General Mental Status-Alert. General Appearance-Consistent with stated age. Hydration-Well hydrated. Voice-Normal.  Chest and Lung Exam Chest and lung exam reveals -quiet, even and easy respiratory effort with no use of accessory muscles and on auscultation, normal breath sounds, no adventitious sounds and normal vocal resonance. Inspection Chest Wall - Normal. Back - normal.  Breast Breast - Left-Symmetric, Non Tender, No Biopsy scars, no Dimpling - Left, No Inflammation, No Lumpectomy scars, No Mastectomy scars, No Peau d' Orange. Breast - Right-Symmetric, Non Tender, No Biopsy scars, no Dimpling - Right, No Inflammation, No Lumpectomy scars, No Mastectomy scars, No Peau d' Orange. Breast Lump-No Palpable Breast Mass.  Cardiovascular Cardiovascular examination reveals -normal heart sounds, regular rate and rhythm with no murmurs and normal pedal pulses bilaterally.  Neurologic Neurologic evaluation reveals -alert and oriented x 3 with no impairment of recent or remote memory. Mental Status-Normal.  Musculoskeletal Normal Exam - Left-Upper Extremity Strength Normal and Lower Extremity Strength Normal. Normal Exam - Right-Upper Extremity Strength Normal and Lower Extremity Strength Normal.  Lymphatic Head & Neck  General Head & Neck Lymphatics: Bilateral - Description - Normal. Axillary  General Axillary Region: Bilateral - Description - Normal. Tenderness - Non Tender.    Assessment & Plan (Tracen Mahler A. Sadhana Frater MD; 10/14/2019 12:36 PM)  PAPILLOMA OF LEFT BREAST (D24.2) Impression: Recommend left breast lumpectomy for papilloma due to potential upgrade risk of 10%. Discuss observation as well the pros and cons of that but given her family history I think lumpectomy would be her best choice. She agrees to proceed. Risk of lumpectomy include  bleeding, infection, seroma, more surgery, use of seed/wire, wound care, cosmetic deformity and the need for other treatments, death , blood clots, death. Pt agrees to proceed.  Current Plans Pt Education - [facility] Free Text Education/Instructions: discussed with patient and provided information. Pt Education - [facility] Breast Biopsy HCI: discussed with patient and  provided information.  FAMILY HISTORY OF BREAST CANCER (Z80.3)   FAMILY HISTORY OF BREAST CANCER IN MOTHER (Z80.3) Impression: refer to genetics

## 2019-11-01 ENCOUNTER — Other Ambulatory Visit: Payer: Self-pay | Admitting: Surgery

## 2019-11-01 DIAGNOSIS — D242 Benign neoplasm of left breast: Secondary | ICD-10-CM

## 2019-11-16 ENCOUNTER — Other Ambulatory Visit: Payer: Self-pay

## 2019-11-16 ENCOUNTER — Encounter (HOSPITAL_BASED_OUTPATIENT_CLINIC_OR_DEPARTMENT_OTHER): Payer: Self-pay | Admitting: Surgery

## 2019-11-19 ENCOUNTER — Other Ambulatory Visit (HOSPITAL_COMMUNITY)
Admission: RE | Admit: 2019-11-19 | Discharge: 2019-11-19 | Disposition: A | Discharge status: Home / Self Care | Payer: 59 | Source: Ambulatory Visit | Attending: Surgery | Admitting: Surgery

## 2019-11-19 DIAGNOSIS — Z01812 Encounter for preprocedural laboratory examination: Secondary | ICD-10-CM | POA: Diagnosis not present

## 2019-11-19 DIAGNOSIS — Z20822 Contact with and (suspected) exposure to covid-19: Secondary | ICD-10-CM | POA: Diagnosis not present

## 2019-11-19 LAB — SARS CORONAVIRUS 2 (TAT 6-24 HRS): SARS Coronavirus 2: NEGATIVE

## 2019-11-21 ENCOUNTER — Encounter (HOSPITAL_BASED_OUTPATIENT_CLINIC_OR_DEPARTMENT_OTHER)
Admission: RE | Admit: 2019-11-21 | Discharge: 2019-11-21 | Disposition: A | Discharge status: Home / Self Care | Payer: 59 | Source: Ambulatory Visit | Attending: Surgery | Admitting: Surgery

## 2019-11-21 DIAGNOSIS — Z01812 Encounter for preprocedural laboratory examination: Secondary | ICD-10-CM | POA: Diagnosis not present

## 2019-11-21 LAB — CBC WITH DIFFERENTIAL/PLATELET
Abs Immature Granulocytes: 0.02 10*3/uL (ref 0.00–0.07)
Basophils Absolute: 0 10*3/uL (ref 0.0–0.1)
Basophils Relative: 0 %
Eosinophils Absolute: 0.1 10*3/uL (ref 0.0–0.5)
Eosinophils Relative: 3 %
HCT: 42 % (ref 36.0–46.0)
Hemoglobin: 13.7 g/dL (ref 12.0–15.0)
Immature Granulocytes: 0 %
Lymphocytes Relative: 29 %
Lymphs Abs: 1.5 10*3/uL (ref 0.7–4.0)
MCH: 29.3 pg (ref 26.0–34.0)
MCHC: 32.6 g/dL (ref 30.0–36.0)
MCV: 89.9 fL (ref 80.0–100.0)
Monocytes Absolute: 0.4 10*3/uL (ref 0.1–1.0)
Monocytes Relative: 8 %
Neutro Abs: 3 10*3/uL (ref 1.7–7.7)
Neutrophils Relative %: 60 %
Platelets: 260 10*3/uL (ref 150–400)
RBC: 4.67 MIL/uL (ref 3.87–5.11)
RDW: 13.1 % (ref 11.5–15.5)
WBC: 5.1 10*3/uL (ref 4.0–10.5)
nRBC: 0 % (ref 0.0–0.2)

## 2019-11-21 LAB — COMPREHENSIVE METABOLIC PANEL
ALT: 14 U/L (ref 0–44)
AST: 16 U/L (ref 15–41)
Albumin: 4.1 g/dL (ref 3.5–5.0)
Alkaline Phosphatase: 86 U/L (ref 38–126)
Anion gap: 8 (ref 5–15)
BUN: 19 mg/dL (ref 6–20)
CO2: 30 mmol/L (ref 22–32)
Calcium: 9.8 mg/dL (ref 8.9–10.3)
Chloride: 104 mmol/L (ref 98–111)
Creatinine, Ser: 0.9 mg/dL (ref 0.44–1.00)
GFR calc Af Amer: >60 mL/min (ref >60)
GFR calc non Af Amer: >60 mL/min (ref >60)
Glucose, Bld: 82 mg/dL (ref 70–99)
Potassium: 5.7 mmol/L — ABNORMAL HIGH (ref 3.5–5.1)
Sodium: 142 mmol/L (ref 135–145)
Total Bilirubin: 0.6 mg/dL (ref 0.3–1.2)
Total Protein: 6.9 g/dL (ref 6.5–8.1)

## 2019-11-21 NOTE — Progress Notes (Signed)

## 2019-11-21 NOTE — Progress Notes (Signed)
K+ 5.7, will recheck day of surgery per Dr. Sabra Heck.

## 2019-11-22 ENCOUNTER — Ambulatory Visit
Admission: RE | Admit: 2019-11-22 | Discharge: 2019-11-22 | Disposition: A | Discharge status: Home / Self Care | Payer: 59 | Source: Ambulatory Visit | Attending: Surgery | Admitting: Surgery

## 2019-11-22 ENCOUNTER — Other Ambulatory Visit: Payer: Self-pay

## 2019-11-22 DIAGNOSIS — D242 Benign neoplasm of left breast: Secondary | ICD-10-CM

## 2019-11-22 NOTE — Anesthesia Preprocedure Evaluation (Addendum)
Anesthesia Evaluation  Patient identified by MRN, date of birth, ID band Patient awake    Reviewed: Allergy & Precautions, NPO status , Patient's Chart, lab work & pertinent test results  History of Anesthesia Complications Negative for: history of anesthetic complications  Airway Mallampati: II  TM Distance: >3 FB Neck ROM: Full    Dental  (+) Dental Advisory Given, Teeth Intact   Pulmonary neg pulmonary ROS,    Pulmonary exam normal        Cardiovascular negative cardio ROS Normal cardiovascular exam     Neuro/Psych negative neurological ROS  negative psych ROS   GI/Hepatic negative GI ROS, Neg liver ROS,   Endo/Other   K 5.1 today    Renal/GU negative Renal ROS     Musculoskeletal  (+) Arthritis ,   Abdominal   Peds  Hematology negative hematology ROS (+)   Anesthesia Other Findings Covid test negative   Reproductive/Obstetrics                           Anesthesia Physical Anesthesia Plan  ASA: I  Anesthesia Plan: General   Post-op Pain Management:    Induction: Intravenous  PONV Risk Score and Plan: 3 and Treatment may vary due to age or medical condition, Ondansetron, Dexamethasone and Midazolam  Airway Management Planned: LMA  Additional Equipment: None  Intra-op Plan:   Post-operative Plan: Extubation in OR  Informed Consent: I have reviewed the patients History and Physical, chart, labs and discussed the procedure including the risks, benefits and alternatives for the proposed anesthesia with the patient or authorized representative who has indicated his/her understanding and acceptance.     Dental advisory given  Plan Discussed with: CRNA and Anesthesiologist  Anesthesia Plan Comments:        Anesthesia Quick Evaluation

## 2019-11-23 ENCOUNTER — Ambulatory Visit (HOSPITAL_BASED_OUTPATIENT_CLINIC_OR_DEPARTMENT_OTHER): Payer: 59 | Admitting: Anesthesiology

## 2019-11-23 ENCOUNTER — Ambulatory Visit (HOSPITAL_BASED_OUTPATIENT_CLINIC_OR_DEPARTMENT_OTHER)
Admission: RE | Admit: 2019-11-23 | Discharge: 2019-11-23 | Disposition: A | Discharge status: Home / Self Care | Payer: 59 | Attending: Surgery | Admitting: Surgery

## 2019-11-23 ENCOUNTER — Encounter (HOSPITAL_BASED_OUTPATIENT_CLINIC_OR_DEPARTMENT_OTHER)
Admission: RE | Disposition: A | Discharge status: Home / Self Care | Payer: Self-pay | Source: Home / Self Care | Attending: Surgery

## 2019-11-23 ENCOUNTER — Other Ambulatory Visit: Payer: Self-pay

## 2019-11-23 ENCOUNTER — Encounter (HOSPITAL_BASED_OUTPATIENT_CLINIC_OR_DEPARTMENT_OTHER): Payer: Self-pay | Admitting: Surgery

## 2019-11-23 ENCOUNTER — Ambulatory Visit
Admission: RE | Admit: 2019-11-23 | Discharge: 2019-11-23 | Disposition: A | Discharge status: Home / Self Care | Payer: 59 | Source: Ambulatory Visit | Attending: Surgery | Admitting: Surgery

## 2019-11-23 DIAGNOSIS — Z86010 Personal history of colonic polyps: Secondary | ICD-10-CM | POA: Diagnosis not present

## 2019-11-23 DIAGNOSIS — Z90710 Acquired absence of both cervix and uterus: Secondary | ICD-10-CM | POA: Diagnosis not present

## 2019-11-23 DIAGNOSIS — M1990 Unspecified osteoarthritis, unspecified site: Secondary | ICD-10-CM | POA: Diagnosis not present

## 2019-11-23 DIAGNOSIS — Z79899 Other long term (current) drug therapy: Secondary | ICD-10-CM | POA: Diagnosis not present

## 2019-11-23 DIAGNOSIS — Z880 Allergy status to penicillin: Secondary | ICD-10-CM | POA: Insufficient documentation

## 2019-11-23 DIAGNOSIS — D242 Benign neoplasm of left breast: Secondary | ICD-10-CM

## 2019-11-23 DIAGNOSIS — N6452 Nipple discharge: Secondary | ICD-10-CM | POA: Diagnosis not present

## 2019-11-23 HISTORY — PX: BREAST LUMPECTOMY WITH RADIOACTIVE SEED LOCALIZATION: SHX6424

## 2019-11-23 LAB — BASIC METABOLIC PANEL
Anion gap: 11 (ref 5–15)
BUN: 15 mg/dL (ref 6–20)
CO2: 25 mmol/L (ref 22–32)
Calcium: 9.5 mg/dL (ref 8.9–10.3)
Chloride: 103 mmol/L (ref 98–111)
Creatinine, Ser: 0.99 mg/dL (ref 0.44–1.00)
GFR calc non Af Amer: >60 mL/min (ref >60)
Glucose, Bld: 72 mg/dL (ref 70–99)
Potassium: 5.1 mmol/L (ref 3.5–5.1)
Sodium: 139 mmol/L (ref 135–145)

## 2019-11-23 SURGERY — BREAST LUMPECTOMY WITH RADIOACTIVE SEED LOCALIZATION
Anesthesia: General | Site: Breast | Laterality: Left

## 2019-11-23 MED ORDER — CLINDAMYCIN PHOSPHATE 900 MG/50ML IV SOLN
INTRAVENOUS | Status: AC
  Filled 2019-11-23: qty 50

## 2019-11-23 MED ORDER — PROMETHAZINE HCL 25 MG/ML IJ SOLN: 6.2500 mg | INTRAMUSCULAR | Status: DC | PRN

## 2019-11-23 MED ORDER — DEXAMETHASONE SODIUM PHOSPHATE 4 MG/ML IJ SOLN
INTRAMUSCULAR | Status: DC | PRN
  Administered 2019-11-23: 4 mg via INTRAVENOUS

## 2019-11-23 MED ORDER — MIDAZOLAM HCL 5 MG/5ML IJ SOLN
INTRAMUSCULAR | Status: DC | PRN
  Administered 2019-11-23: 2 mg via INTRAVENOUS

## 2019-11-23 MED ORDER — FENTANYL CITRATE (PF) 100 MCG/2ML IJ SOLN
25.0000 ug | INTRAMUSCULAR | Status: DC | PRN
  Administered 2019-11-23 (×2): 50 ug via INTRAVENOUS

## 2019-11-23 MED ORDER — FENTANYL CITRATE (PF) 100 MCG/2ML IJ SOLN
INTRAMUSCULAR | Status: DC | PRN
Start: 2019-11-23 — End: 2019-11-23
  Administered 2019-11-23 (×2): 25 ug via INTRAVENOUS
  Administered 2019-11-23: 50 ug via INTRAVENOUS

## 2019-11-23 MED ORDER — OXYCODONE HCL 5 MG PO TABS
5.0000 mg | ORAL_TABLET | Freq: Once | ORAL | Status: AC | PRN
  Administered 2019-11-23: 5 mg via ORAL

## 2019-11-23 MED ORDER — IBUPROFEN 800 MG PO TABS: 800.0000 mg | ORAL_TABLET | Freq: Three times a day (TID) | ORAL | 0 refills | Status: AC | PRN

## 2019-11-23 MED ORDER — LIDOCAINE 2% (20 MG/ML) 5 ML SYRINGE
INTRAMUSCULAR | Status: AC
  Filled 2019-11-23: qty 5

## 2019-11-23 MED ORDER — LIDOCAINE 2% (20 MG/ML) 5 ML SYRINGE
INTRAMUSCULAR | Status: DC | PRN
  Administered 2019-11-23: 60 mg via INTRAVENOUS

## 2019-11-23 MED ORDER — FENTANYL CITRATE (PF) 100 MCG/2ML IJ SOLN
INTRAMUSCULAR | Status: AC
  Filled 2019-11-23: qty 2

## 2019-11-23 MED ORDER — MIDAZOLAM HCL 2 MG/2ML IJ SOLN
INTRAMUSCULAR | Status: AC
  Filled 2019-11-23: qty 2

## 2019-11-23 MED ORDER — CHLORHEXIDINE GLUCONATE CLOTH 2 % EX PADS: 6.0000 | MEDICATED_PAD | Freq: Once | CUTANEOUS | Status: DC

## 2019-11-23 MED ORDER — CLINDAMYCIN PHOSPHATE 900 MG/50ML IV SOLN
900.0000 mg | INTRAVENOUS | Status: AC
  Administered 2019-11-23: 900 mg via INTRAVENOUS

## 2019-11-23 MED ORDER — HYDROCODONE-ACETAMINOPHEN 5-325 MG PO TABS: 1.0000 | ORAL_TABLET | Freq: Four times a day (QID) | ORAL | 0 refills | Status: AC | PRN

## 2019-11-23 MED ORDER — OXYCODONE HCL 5 MG PO TABS
ORAL_TABLET | ORAL | Status: AC
  Filled 2019-11-23: qty 1

## 2019-11-23 MED ORDER — OXYCODONE HCL 5 MG/5ML PO SOLN: 5.0000 mg | Freq: Once | ORAL | Status: AC | PRN

## 2019-11-23 MED ORDER — LACTATED RINGERS IV SOLN: INTRAVENOUS | Status: DC

## 2019-11-23 MED ORDER — BUPIVACAINE HCL (PF) 0.25 % IJ SOLN
INTRAMUSCULAR | Status: DC | PRN
  Administered 2019-11-23: 20 mL

## 2019-11-23 MED ORDER — PROPOFOL 10 MG/ML IV BOLUS
INTRAVENOUS | Status: DC | PRN
  Administered 2019-11-23: 180 mg via INTRAVENOUS

## 2019-11-23 MED ORDER — PROPOFOL 10 MG/ML IV BOLUS
INTRAVENOUS | Status: AC
  Filled 2019-11-23: qty 20

## 2019-11-23 SURGICAL SUPPLY — 55 items
ADH SKN CLS APL DERMABOND .7 (GAUZE/BANDAGES/DRESSINGS) ×1
APL PRP STRL LF DISP 70% ISPRP (MISCELLANEOUS) ×1
APPLIER CLIP 9.375 MED OPEN (MISCELLANEOUS)
APR CLP MED 9.3 20 MLT OPN (MISCELLANEOUS)
BINDER BREAST LRG (GAUZE/BANDAGES/DRESSINGS) IMPLANT
BINDER BREAST MEDIUM (GAUZE/BANDAGES/DRESSINGS) IMPLANT
BINDER BREAST XLRG (GAUZE/BANDAGES/DRESSINGS) ×1 IMPLANT
BINDER BREAST XXLRG (GAUZE/BANDAGES/DRESSINGS) IMPLANT
BLADE SURG 15 STRL LF DISP TIS (BLADE) ×1 IMPLANT
BLADE SURG 15 STRL SS (BLADE) ×2
CANISTER SUC SOCK COL 7IN (MISCELLANEOUS) ×1 IMPLANT
CANISTER SUCT 1200ML W/VALVE (MISCELLANEOUS) IMPLANT
CHLORAPREP W/TINT 26 (MISCELLANEOUS) ×2 IMPLANT
CLIP APPLIE 9.375 MED OPEN (MISCELLANEOUS) IMPLANT
COVER BACK TABLE 60X90IN (DRAPES) ×2 IMPLANT
COVER MAYO STAND STRL (DRAPES) ×2 IMPLANT
COVER PROBE W GEL 5X96 (DRAPES) ×2 IMPLANT
COVER WAND RF STERILE (DRAPES) IMPLANT
DECANTER SPIKE VIAL GLASS SM (MISCELLANEOUS) IMPLANT
DERMABOND ADVANCED (GAUZE/BANDAGES/DRESSINGS) ×1
DERMABOND ADVANCED .7 DNX12 (GAUZE/BANDAGES/DRESSINGS) ×1 IMPLANT
DRAPE LAPAROSCOPIC ABDOMINAL (DRAPES) IMPLANT
DRAPE LAPAROTOMY 100X72 PEDS (DRAPES) ×2 IMPLANT
DRAPE UTILITY XL STRL (DRAPES) ×2 IMPLANT
ELECT COATED BLADE 2.86 ST (ELECTRODE) ×2 IMPLANT
ELECT REM PT RETURN 9FT ADLT (ELECTROSURGICAL) ×2
ELECTRODE REM PT RTRN 9FT ADLT (ELECTROSURGICAL) ×1 IMPLANT
GLOVE BIOGEL PI IND STRL 7.0 (GLOVE) IMPLANT
GLOVE BIOGEL PI IND STRL 8 (GLOVE) ×1 IMPLANT
GLOVE BIOGEL PI INDICATOR 7.0 (GLOVE) ×2
GLOVE BIOGEL PI INDICATOR 8 (GLOVE) ×1
GLOVE ECLIPSE 6.5 STRL STRAW (GLOVE) ×1 IMPLANT
GLOVE ECLIPSE 8.0 STRL XLNG CF (GLOVE) ×2 IMPLANT
GOWN STRL REUS W/ TWL LRG LVL3 (GOWN DISPOSABLE) ×2 IMPLANT
GOWN STRL REUS W/ TWL XL LVL3 (GOWN DISPOSABLE) IMPLANT
GOWN STRL REUS W/TWL LRG LVL3 (GOWN DISPOSABLE) ×2
GOWN STRL REUS W/TWL XL LVL3 (GOWN DISPOSABLE) ×2
HEMOSTAT ARISTA ABSORB 3G PWDR (HEMOSTASIS) IMPLANT
HEMOSTAT SNOW SURGICEL 2X4 (HEMOSTASIS) IMPLANT
KIT MARKER MARGIN INK (KITS) ×2 IMPLANT
NDL HYPO 25X1 1.5 SAFETY (NEEDLE) ×1 IMPLANT
NEEDLE HYPO 25X1 1.5 SAFETY (NEEDLE) ×2 IMPLANT
NS IRRIG 1000ML POUR BTL (IV SOLUTION) ×2 IMPLANT
PACK BASIN DAY SURGERY FS (CUSTOM PROCEDURE TRAY) ×2 IMPLANT
PENCIL SMOKE EVACUATOR (MISCELLANEOUS) ×2 IMPLANT
SLEEVE SCD COMPRESS KNEE MED (MISCELLANEOUS) ×2 IMPLANT
SPONGE LAP 4X18 RFD (DISPOSABLE) ×2 IMPLANT
SUT MNCRL AB 4-0 PS2 18 (SUTURE) ×2 IMPLANT
SUT SILK 2 0 SH (SUTURE) ×1 IMPLANT
SUT VICRYL 3-0 CR8 SH (SUTURE) ×2 IMPLANT
SYR CONTROL 10ML LL (SYRINGE) ×2 IMPLANT
TOWEL GREEN STERILE FF (TOWEL DISPOSABLE) ×2 IMPLANT
TRAY FAXITRON CT DISP (TRAY / TRAY PROCEDURE) ×2 IMPLANT
TUBE CONNECTING 20X1/4 (TUBING) ×1 IMPLANT
YANKAUER SUCT BULB TIP NO VENT (SUCTIONS) ×1 IMPLANT

## 2019-11-23 NOTE — Interval H&P Note (Signed)
History and Physical Interval Note:  11/23/2019 9:27 AM  Beverly Munoz  has presented today for surgery, with the diagnosis of PAPILLOMA LEFT BREAST.  The various methods of treatment have been discussed with the patient and family. After consideration of risks, benefits and other options for treatment, the patient has consented to  Procedure(s): LEFT BREAST LUMPECTOMY WITH RADIOACTIVE SEED LOCALIZATION (Left) as a surgical intervention.  The patient's history has been reviewed, patient examined, no change in status, stable for surgery.  I have reviewed the patient's chart and labs.  Questions were answered to the patient's satisfaction.     Turner Daniels MD

## 2019-11-23 NOTE — Discharge Instructions (Signed)
Oxycodone given at 11:50am.    Post Anesthesia Home Care Instructions  Activity: Get plenty of rest for the remainder of the day. A responsible individual must stay with you for 24 hours following the procedure.  For the next 24 hours, DO NOT: -Drive a car -Paediatric nurse -Drink alcoholic beverages -Take any medication unless instructed by your physician -Make any legal decisions or sign important papers.  Meals: Start with liquid foods such as gelatin or soup. Progress to regular foods as tolerated. Avoid greasy, spicy, heavy foods. If nausea and/or vomiting occur, drink only clear liquids until the nausea and/or vomiting subsides. Call your physician if vomiting continues.  Special Instructions/Symptoms: Your throat may feel dry or sore from the anesthesia or the breathing tube placed in your throat during surgery. If this causes discomfort, gargle with warm salt water. The discomfort should disappear within 24 hours.       [place] Office Phone Number [phone]  BREAST BIOPSY/ PARTIAL MASTECTOMY: POST OP INSTRUCTIONS  Always review your discharge instruction sheet given to you by the facility where your surgery was performed.  IF YOU HAVE DISABILITY OR FAMILY LEAVE FORMS, YOU MUST BRING THEM TO THE OFFICE FOR PROCESSING.  DO NOT GIVE THEM TO YOUR DOCTOR.  1. A prescription for pain medication may be given to you upon discharge.  Take your pain medication as prescribed, if needed.  If narcotic pain medicine is not needed, then you may take acetaminophen (Tylenol) or ibuprofen (Advil) as needed. 2. Take your usually prescribed medications unless otherwise directed 3. If you need a refill on your pain medication, please contact your pharmacy.  They will contact our office to request authorization.  Prescriptions will not be filled after 5pm or on week-ends. 4. You should eat very light the first 24 hours after surgery, such as soup, crackers, pudding,  etc.  Resume your normal diet the day after surgery. 5. Most patients will experience some swelling and bruising in the breast.  Ice packs and a good support bra will help.  Swelling and bruising can take several days to resolve.  6. It is common to experience some constipation if taking pain medication after surgery.  Increasing fluid intake and taking a stool softener will usually help or prevent this problem from occurring.  A mild laxative (Milk of Magnesia or Miralax) should be taken according to package directions if there are no bowel movements after 48 hours. 7. Unless discharge instructions indicate otherwise, you may remove your bandages 24-48 hours after surgery, and you may shower at that time.  You may have steri-strips (small skin tapes) in place directly over the incision.  These strips should be left on the skin for 7-10 days.  If your surgeon used skin glue on the incision, you may shower in 24 hours.  The glue will flake off over the next 2-3 weeks.  Any sutures or staples will be removed at the office during your follow-up visit. 8. ACTIVITIES:  You may resume regular daily activities (gradually increasing) beginning the next day.  Wearing a good support bra or sports bra minimizes pain and swelling.  You may have sexual intercourse when it is comfortable. a. You may drive when you no longer are taking prescription pain medication, you can comfortably wear a seatbelt, and you can safely maneuver your car and apply brakes. b. RETURN TO WORK:  ______________________________________________________________________________________ 9. You should see your doctor in the office for a follow-up appointment approximately two weeks after your  surgery.  Your doctor's nurse will typically make your follow-up appointment when she calls you with your pathology report.  Expect your pathology report 2-3 business days after your surgery.  You may call to check if you do not hear from [place] after three  days. 10. OTHER INSTRUCTIONS: _______________________________________________________________________________________________ _____________________________________________________________________________________________________________________________________ _____________________________________________________________________________________________________________________________________ _____________________________________________________________________________________________________________________________________  WHEN TO CALL YOUR DOCTOR: 1. Fever over 101.0 2. Nausea and/or vomiting. 3. Extreme swelling or bruising. 4. Continued bleeding from incision. 5. Increased pain, redness, or drainage from the incision.  The clinic staff is available to answer your questions during regular business hours.  Please don't hesitate to call and ask to speak to one of the nurses for clinical concerns.  If you have a medical emergency, go to the nearest emergency room or call 911.  A surgeon from [facility] is always on call at the hospital.  For further questions, please visit [email or web address]

## 2019-11-23 NOTE — Op Note (Signed)
Peoperative diagnosis: Left breast papilloma  Postoperative diagnosis: Same   Procedure: Left breast seed localized lumpectomy  Surgeon: Erroll Luna M.D.  Anesthesia: Gen. With 0.25% Sensorcaine local  EBL: 20 cc  Specimen: Left breast tissue with clip and radioactive seed in the specimen. Verified with neoprobe and radiographic image showing both seed and clip in specimen  Indications for procedure: The patient presents for left breast excisional lumpectomy after core biopsy showed papilloma. Discussed the rationale for considering excision. Small risk of malignancy associated with papilloma lesion after core biopsy. Discussed observation. Discussed wire localization. Patient desired lumpectomy  of left breast papilloma.The procedure has been discussed with the patient. Alternatives to surgery have been discussed with the patient.  Risks of surgery include bleeding,  Infection,  Seroma formation, death,  and the need for further surgery.   The patient understands and wishes to proceed.   Description of procedure: Patient underwent seed placement as an outpatient. Patient presents today for left breast seed localized lumpectomy. Patient and holding area. Questions are answered and neoprobe used to verify seed location. Patient taken back to the operating room and placed upon the OR table. After induction of general anesthesia, left breast prepped and draped in a sterile fashion. Timeout was done to verify proper sizing procedure. Neoprobe used and hot spot identified and left breast upper-outer quadrant. This was marked with pen. Curvilinear incision made left upper outer quadrant breast. Dissection used with the help of a neoprobe around the tissue where the seed and clip were located. Tissue removed in its entirety with gross margins.Marlis Edelson used and seen within specimen. Radiographs taken which show clip and seed  In  specimen.hemostasis achieved and cavity closed with 3-0 Vicryl and 4-0  Monocryl. Dermabond applied. All final counts found to be correct. Specimen transported to pathology. Patient awoke extubated taken to recovery in satisfactory condition.

## 2019-11-23 NOTE — H&P (Signed)
Beverly Munoz Location: [facility] Patient #: [id number] DOB: 11/30/1960 Divorced / Language: English / Race: White Female  History of Present Illness  Patient words: Patient presents for evaluation of intermittent yellowish and clear discharge from the left nipple over the last 3 weeks. Patient states that the discharge is both spontaneous and with expression. Discharge is present for a few months. It is primarily clear MOC from her left breast without any indication it comes from the right. Mammogram and ultrasound core biopsy showed a left breast papilloma was biopsied. She's had no further drainage since the biopsy was done. Her mother had breast cancer in her 69s. No other gynecological or other malignancies noted  EXAM: DIGITAL DIAGNOSTIC LEFT MAMMOGRAM WITH CAD AND TOMO  ULTRASOUND LEFT BREAST  COMPARISON: Previous exam(s).  ACR Breast Density Category b: There are scattered areas of fibroglandular density.  FINDINGS: Within the outer aspect of the left breast middle depth there is a small asymmetry. No additional masses, calcifications or nonsurgical distortion identified within the left breast. Postsurgical changes of the superior left breast on the MLO view.  Mammographic images were processed with CAD.  On physical exam, small amount of clear discharge is elicited with palpation.  Targeted ultrasound is performed, showing a mildly dilated duct within the retroareolar left breast 3 o'clock position. Within the more peripheral aspect of the duct there is a 19 x 2 x 7 mm intraductal mass left breast 3 o'clock position 5 cm from nipple. This likely corresponds with the asymmetry identified in the outer left breast on mammography.  No left axillary adenopathy.  IMPRESSION: Intraductal mass within the left breast 3 o'clock position. This likely corresponds with the asymmetry identified within the outer left breast on  mammography.  RECOMMENDATION: Ultrasound-guided core needle biopsy intraductal mass left breast.  I have discussed the findings and recommendations with the patient. If applicable, a reminder letter will be sent to the patient regarding the next appointment.  BI-RADS CATEGORY 4: Suspicious.   Electronically Signed By: Lovey Newcomer M.D. On: 09/07/2019 10:00             Diagnosis Breast, left, needle core biopsy, 3 o'clock - INTRADUCTAL PAPILLOMA WITH ATYPICAL DUCTAL HYPERPLASIA. Microscopic Comment Dr. Jeannie Done has reviewed the case. The case was called to [facility] on 09/16/19. Vicente Males MD Pathologist, Electronic Signature (Case signed 09/16/2019) Specimen Gross and Clinical Information.  The patient is a 59 year old female.   Past Surgical History Breast Biopsy Bilateral. Cesarean Section - 1 Colon Polyp Removal - Colonoscopy Hysterectomy (not due to cancer) - Partial Oral Surgery Tonsillectomy  Diagnostic Studies History  Colonoscopy 1-5 years ago Mammogram within last year Pap Smear 1-5 years ago  Allergies  Penicillin G Pot in Dextrose *PENICILLINS* Allergies Reconciled  Medication History Fish Oil (1200MG  Capsule, Oral) Active. Multivitamin (Oral) Active. QC Tumeric Complex (500MG  Capsule, Oral) Active. Medications Reconciled  Pregnancy / Birth History Age at menarche 68 years. Gravida 4 Length (months) of breastfeeding 3-6 Maternal age 91-30 Para 2  Other Problems  No pertinent past medical history     Review of Systems General Not Present- Appetite Loss, Chills, Fatigue, Fever, Night Sweats, Weight Gain and Weight Loss. Skin Not Present- Change in Wart/Mole, Dryness, Hives, Jaundice, New Lesions, Non-Healing Wounds, Rash and Ulcer. HEENT Not Present- Earache, Hearing Loss, Hoarseness, Nose Bleed, Oral Ulcers, Ringing in the Ears, Seasonal Allergies, Sinus  Pain, Sore Throat, Visual Disturbances, Wears glasses/contact lenses and Yellow Eyes. Respiratory Not  Present- Bloody sputum, Chronic Cough, Difficulty Breathing, Snoring and Wheezing. Breast Present- Nipple Discharge. Not Present- Breast Mass, Breast Pain and Skin Changes. Cardiovascular Not Present- Chest Pain, Difficulty Breathing Lying Down, Leg Cramps, Palpitations, Rapid Heart Rate, Shortness of Breath and Swelling of Extremities. Gastrointestinal Not Present- Abdominal Pain, Bloating, Bloody Stool, Change in Bowel Habits, Chronic diarrhea, Constipation, Difficulty Swallowing, Excessive gas, Gets full quickly at meals, Hemorrhoids, Indigestion, Nausea, Rectal Pain and Vomiting. Female Genitourinary Not Present- Frequency, Nocturia, Painful Urination, Pelvic Pain and Urgency. Musculoskeletal Not Present- Back Pain, Joint Pain, Joint Stiffness, Muscle Pain, Muscle Weakness and Swelling of Extremities. Neurological Not Present- Decreased Memory, Fainting, Headaches, Numbness, Seizures, Tingling, Tremor, Trouble walking and Weakness. Psychiatric Not Present- Anxiety, Bipolar, Change in Sleep Pattern, Depression, Fearful and Frequent crying. Endocrine Present- Hot flashes. Not Present- Cold Intolerance, Excessive Hunger, Hair Changes, Heat Intolerance and New Diabetes. Hematology Not Present- Blood Thinners, Easy Bruising, Excessive bleeding, Gland problems, HIV and Persistent Infections.  Vitals  10/14/2019 9:28 AM Weight: 185 lb Height: 67in Body Surface Area: 1.96 m Body Mass Index: 28.97 kg/m  Temp.: 97.3F  Pulse: 77 (Regular)  P.OX: 99% (Room air) BP: 114/70(Sitting, Left Arm, Standard)        Physical Exam   General Mental Status-Alert. General Appearance-Consistent with stated age. Hydration-Well hydrated. Voice-Normal.  Chest and Lung Exam Chest and lung exam reveals -quiet, even and easy respiratory effort with no use of accessory muscles  and on auscultation, normal breath sounds, no adventitious sounds and normal vocal resonance. Inspection Chest Wall - Normal. Back - normal.  Breast Breast - Left-Symmetric, Non Tender, No Biopsy scars, no Dimpling - Left, No Inflammation, No Lumpectomy scars, No Mastectomy scars, No Peau d' Orange. Breast - Right-Symmetric, Non Tender, No Biopsy scars, no Dimpling - Right, No Inflammation, No Lumpectomy scars, No Mastectomy scars, No Peau d' Orange. Breast Lump-No Palpable Breast Mass.  Cardiovascular Cardiovascular examination reveals -normal heart sounds, regular rate and rhythm with no murmurs and normal pedal pulses bilaterally.  Neurologic Neurologic evaluation reveals -alert and oriented x 3 with no impairment of recent or remote memory. Mental Status-Normal.  Musculoskeletal Normal Exam - Left-Upper Extremity Strength Normal and Lower Extremity Strength Normal. Normal Exam - Right-Upper Extremity Strength Normal and Lower Extremity Strength Normal.  Lymphatic Head & Neck  General Head & Neck Lymphatics: Bilateral - Description - Normal. Axillary  General Axillary Region: Bilateral - Description - Normal. Tenderness - Non Tender.    Assessment & Plan  PAPILLOMA OF LEFT BREAST (D24.2) Impression: Recommend left breast lumpectomy for papilloma due to potential upgrade risk of 10%. Discuss observation as well the pros and cons of that but given her family history I think lumpectomy would be her best choice. She agrees to proceed. Risk of lumpectomy include bleeding, infection, seroma, more surgery, use of seed/wire, wound care, cosmetic deformity and the need for other treatments, death , blood clots, death. Pt agrees to proceed.  Current Plans Pt Education - [facility] Free Text Education/Instructions: discussed with patient and provided information. Pt Education - [facility] Breast Biopsy HCI: discussed with patient and provided information.  FAMILY HISTORY  OF BREAST CANCER (Z80.3)   FAMILY HISTORY OF BREAST CANCER IN MOTHER (Z80.3) Impression: refer to genetics

## 2019-11-23 NOTE — Anesthesia Procedure Notes (Signed)
Procedure Name: LMA Insertion Date/Time: 11/23/2019 9:53 AM Performed by: Maryella Shivers, CRNA Pre-anesthesia Checklist: Patient identified, Emergency Drugs available, Suction available and Patient being monitored Patient Re-evaluated:Patient Re-evaluated prior to induction Oxygen Delivery Method: Circle system utilized Preoxygenation: Pre-oxygenation with 100% oxygen Induction Type: IV induction Ventilation: Mask ventilation without difficulty LMA: LMA inserted LMA Size: 4.0 Number of attempts: 1 Airway Equipment and Method: Bite block Placement Confirmation: positive ETCO2 Tube secured with: Tape Dental Injury: Teeth and Oropharynx as per pre-operative assessment

## 2019-11-23 NOTE — Transfer of Care (Signed)
Immediate Anesthesia Transfer of Care Note  Patient: Beverly Munoz  Procedure(s) Performed: LEFT BREAST LUMPECTOMY WITH RADIOACTIVE SEED LOCALIZATION (Left Breast)  Patient Location: PACU  Anesthesia Type:General  Level of Consciousness: sedated  Airway & Oxygen Therapy: Patient Spontanous Breathing and Patient connected to face mask oxygen  Post-op Assessment: Report given to RN and Post -op Vital signs reviewed and stable  Post vital signs: Reviewed and stable  Last Vitals:  Vitals Value Taken Time  BP 128/78 11/23/19 1043  Temp    Pulse 87 11/23/19 1044  Resp 14 11/23/19 1044  SpO2 100 % 11/23/19 1044    Last Pain:  Vitals:   11/23/19 0825  TempSrc: Oral  PainSc: 0-No pain      Patients Stated Pain Goal: 1 (09/92/78 0044)  Complications: No complications documented.

## 2019-11-23 NOTE — Anesthesia Postprocedure Evaluation (Signed)
Anesthesia Post Note  Patient: Beverly Munoz  Procedure(s) Performed: LEFT BREAST LUMPECTOMY WITH RADIOACTIVE SEED LOCALIZATION (Left Breast)     Patient location during evaluation: PACU Anesthesia Type: General Level of consciousness: awake and alert Pain management: pain level controlled Vital Signs Assessment: post-procedure vital signs reviewed and stable Respiratory status: spontaneous breathing, nonlabored ventilation and respiratory function stable Cardiovascular status: blood pressure returned to baseline and stable Postop Assessment: no apparent nausea or vomiting Anesthetic complications: no   No complications documented.  Last Vitals:  Vitals:   11/23/19 1130 11/23/19 1131  BP: 117/70   Pulse: 73 75  Resp: 20 16  Temp:    SpO2: 100% 100%    Last Pain:  Vitals:   11/23/19 1130  TempSrc:   PainSc: [place]

## 2019-11-24 ENCOUNTER — Encounter (HOSPITAL_BASED_OUTPATIENT_CLINIC_OR_DEPARTMENT_OTHER): Payer: Self-pay | Admitting: Surgery

## 2019-11-24 LAB — SURGICAL PATHOLOGY

## 2019-12-08 ENCOUNTER — Telehealth: Payer: Self-pay | Admitting: Genetic Counselor

## 2019-12-08 NOTE — Telephone Encounter (Signed)
Received a genetic counseling referral from Dr. Brantley Stage for fhx of breast cancer. [name] has been cld and scheduled for a mychart video visit on 11/4 at 10am. I sent a link to the pt to sign up for mychart in order to access the appt.

## 2019-12-21 ENCOUNTER — Telehealth: Payer: Self-pay | Admitting: Licensed Clinical Social Worker

## 2019-12-21 NOTE — Telephone Encounter (Signed)
Contacted patient to verify mychart video visit for pre reg 

## 2019-12-22 ENCOUNTER — Inpatient Hospital Stay: Payer: 59 | Attending: Genetic Counselor | Admitting: Licensed Clinical Social Worker

## 2019-12-22 ENCOUNTER — Encounter: Payer: Self-pay | Admitting: Licensed Clinical Social Worker

## 2019-12-22 DIAGNOSIS — Z8042 Family history of malignant neoplasm of prostate: Secondary | ICD-10-CM

## 2019-12-22 DIAGNOSIS — N6099 Unspecified benign mammary dysplasia of unspecified breast: Secondary | ICD-10-CM

## 2019-12-22 DIAGNOSIS — Z803 Family history of malignant neoplasm of breast: Secondary | ICD-10-CM | POA: Insufficient documentation

## 2019-12-22 DIAGNOSIS — Z806 Family history of leukemia: Secondary | ICD-10-CM

## 2019-12-22 NOTE — Progress Notes (Signed)
REFERRING PROVIDER: Erroll Luna, MD [address] [place],   [address]  PRIMARY PROVIDER:  Patient, No Pcp Per  PRIMARY REASON FOR VISIT:  1. Family history of breast cancer   2. Family history of prostate cancer   3. Family history of leukemia   4. Atypical ductal hyperplasia of breast    I connected with Beverly Munoz on 12/22/2019 at 9:50 AM EDT by [place] video conference and verified that I am speaking with the correct person using two identifiers.    Patient location: home Provider location: [place]:   Beverly Munoz, a 59 y.o. female, was seen for a [facility] cancer genetics consultation at the request of Dr. Brantley Stage due to a family history of cancer and personal history of ADH.  Beverly Munoz presents to clinic today to discuss the possibility of a hereditary predisposition to cancer, genetic testing, and to further clarify her future cancer risks, as well as potential cancer risks for family members.    Beverly Munoz is a 59 y.o. female with no personal history of cancer.  She recently had a lumpectomy on 11/23/2019 that revealed a papilloma, and biopsy in July that showed papilloma with atypical ductal hyperplasia.   CANCER HISTORY:  Oncology History   No history exists.     RISK FACTORS:  Menarche was at age 64.  First live birth at age 20.  OCP use for approximately 3 years.  Ovaries intact: yes.  Hysterectomy: partial.  Menopausal status: postmenopausal.  HRT use: 0 years. Colonoscopy: yes; patient reports 1 polyp. Mammogram within the last year: yes. Number of breast biopsies: 3. Up to date with pelvic exams: yes. Any excessive radiation exposure in the past: no  Past Medical History:  Diagnosis Date  . Anemia   . Arthritis    right hip BETTER SINCE LOST WEIGHT  . Atypical ductal hyperplasia of breast   . Edema    Hx- no DVT on venous dopplers, nl LVF on echo, normal BNP/TSH  . Family history of breast cancer    . Family history of leukemia   . Family history of prostate cancer   . Pain in joint     Past Surgical History:  Procedure Laterality Date  . adnoids    . BREAST LUMPECTOMY Left    benign  . BREAST LUMPECTOMY WITH RADIOACTIVE SEED LOCALIZATION Left 11/23/2019   Procedure: LEFT BREAST LUMPECTOMY WITH RADIOACTIVE SEED LOCALIZATION;  Surgeon: Erroll Luna, MD;  Location: [facility];  Service: General;  Laterality: Left;  . CESAREAN SECTION  1998   x 1  twins  . COLONOSCOPY  12/2014  . DILATATION & CURETTAGE/HYSTEROSCOPY WITH MYOSURE N/A 08/23/2015   Procedure: DILATATION & CURETTAGE/HYSTEROSCOPY WITH MYOSURE;  Surgeon: Brien Few, MD;  Location: [facility];  Service: Gynecology;  Laterality: N/A;  . DILATION AND CURETTAGE OF UTERUS    . fibroid removed from uterus     lower abd incision for this procedure  . HYSTEROSCOPY WITH NOVASURE N/A 08/23/2015   Procedure: HYSTEROSCOPY WITH NOVASURE;  Surgeon: Brien Few, MD;  Location: [facility];  Service: Gynecology;  Laterality: N/A;  . LEFT HEART CATHETERIZATION WITH CORONARY ANGIOGRAM N/A 01/03/2013   Procedure: LEFT HEART CATHETERIZATION WITH CORONARY ANGIOGRAM;  Surgeon: Sueanne Margarita, MD;  Location: [facility];  Service: Cardiovascular;  Laterality: N/A; - Cath Normal, EF 55%  . NASAL SINUS SURGERY    . ROBOTIC ASSISTED TOTAL HYSTERECTOMY WITH SALPINGECTOMY  Bilateral 02/04/2016   Procedure: ROBOTIC ASSISTED TOTAL HYSTERECTOMY WITH SALPINGECTOMY/Possible Bilateral Salpingo-Oophorectomy;  Surgeon: Olivia Mackie, MD;  Location: [facility];  Service: Gynecology;  Laterality: Bilateral;  2 1/2 hrs. requested  . TONSILLECTOMY    . WISDOM TOOTH EXTRACTION      Social History   Socioeconomic History  . Marital status: Divorced    Spouse name: Not on file  . Number of children: Not on file  . Years of education: Not on file  . Highest education level: Not on file  Occupational History  . Not on file  Tobacco Use  . Smoking  status: Never Smoker  . Smokeless tobacco: Never Used  Substance and Sexual Activity  . Alcohol use: Yes    Comment: rare wine  . Drug use: No  . Sexual activity: Not Currently    Birth control/protection: None  Other Topics Concern  . Not on file  Social History Narrative  . Not on file   Social Determinants of Health   Financial Resource Strain:   . Difficulty of Paying Living Expenses: Not on file  Food Insecurity:   . Worried About Programme researcher, broadcasting/film/video in the Last Year: Not on file  . Ran Out of Food in the Last Year: Not on file  Transportation Needs:   . Lack of Transportation (Medical): Not on file  . Lack of Transportation (Non-Medical): Not on file  Physical Activity:   . Days of Exercise per Week: Not on file  . Minutes of Exercise per Session: Not on file  Stress:   . Feeling of Stress : Not on file  Social Connections:   . Frequency of Communication with Friends and Family: Not on file  . Frequency of Social Gatherings with Friends and Family: Not on file  . Attends Religious Services: Not on file  . Active Member of Clubs or Organizations: Not on file  . Attends Banker Meetings: Not on file  . Marital Status: Not on file     FAMILY HISTORY:  We obtained a detailed, 4-generation family history.  Significant diagnoses are listed below: Family History  Problem Relation Age of Onset  . Breast cancer Mother        dx 21s  . Heart failure Father   . Prostate cancer Father        dx 85s  . Leukemia Paternal Grandmother   . Cancer Cousin        unk type d. 30s   Beverly Munoz has twin daughter and son, age 32, no cancer for either. She has a brother and a sister, no cancers.   Beverly Munoz mother had breast cancer in her 76s and is living at 51. Patient has 1 maternal aunt, no cancers. One maternal cousin died in his 30s of cancer, unknown type. Maternal grandparents passed in their 53s-90s.  Beverly Munoz father had prostate cancer in his 110s and is  living at 78. He also had something removed from his neck but patient is not sure if this was cancerous. Patient had 1 paternal uncle, no cancer. No cancer in cousins. Grandmother had leukemia. Paternal grandfather died of dementia in his 63s.  Beverly Munoz is unaware of previous family history of genetic testing for hereditary cancer risks. Patient's maternal ancestors are of European descent, and paternal ancestors are of [place] descent. There is no reported Ashkenazi Jewish ancestry. There is no known consanguinity.    GENETIC COUNSELING ASSESSMENT: Beverly Munoz is a 59 y.o. female  with a family history which is somewhat suggestive of a hereditary cancer syndrome and predisposition to cancer. We, therefore, discussed and recommended the following at today's visit.   DISCUSSION: We discussed that approximately 5-10% of breast cancer is hereditary  Most cases of hereditary breast cancer are associated with BRCA1/BRCA2 genes, although there are other genes associated with hereditary breast cancer as well including PALB2, CHEK2, ATM.  We discussed that testing is beneficial for several reasons including knowing about cancer risks, identifying potential screening and risk-reduction options that may be appropriate, and to understand if other family members could be at risk for cancer and allow them to undergo genetic testing.   We reviewed the characteristics, features and inheritance patterns of hereditary cancer syndromes. We also discussed genetic testing, including the appropriate family members to test, the process of testing, insurance coverage and turn-around-time for results. We discussed the implications of a negative, positive and/or variant of uncertain significant result. We recommended Beverly Munoz pursue genetic testing for the [employer] gene panel.   The Multi-Cancer Panel offered by [facility] includes sequencing and/or deletion duplication testing of the following 85 genes: AIP, ALK, APC,  ATM, AXIN2,BAP1,  BARD1, BLM, BMPR1A, BRCA1, BRCA2, BRIP1, CASR, CDC73, CDH1, CDK4, CDKN1B, CDKN1C, CDKN2A (p14ARF), CDKN2A (p16INK4a), CEBPA, CHEK2, CTNNA1, DICER1, DIS3L2, EGFR (c.2369C>T, p.Thr790Met variant only), EPCAM (Deletion/duplication testing only), FH, FLCN, GATA2, GPC3, GREM1 (Promoter region deletion/duplication testing only), HOXB13 (c.251G>A, p.Gly84Glu), HRAS, KIT, MAX, MEN1, MET, MITF (c.952G>A, p.Glu318Lys variant only), MLH1, MSH2, MSH3, MSH6, MUTYH, NBN, NF1, NF2, NTHL1, PALB2, PDGFRA, PHOX2B, PMS2, POLD1, POLE, POT1, PRKAR1A, PTCH1, PTEN, RAD50, RAD51C, RAD51D, RB1, RECQL4, RET, RNF43, RUNX1, SDHAF2, SDHA (sequence changes only), SDHB, SDHC, SDHD, SMAD4, SMARCA4, SMARCB1, SMARCE1, STK11, SUFU, TERC, TERT, TMEM127, TP53, TSC1, TSC2, VHL, WRN and WT1.   Based on Beverly Munoz's family history of cancer, she does not quite meet medical criteria for genetic testing and will pay the $250 patient pay price for her testing.   PLAN: After considering the risks, benefits, and limitations, Beverly Munoz provided informed consent to pursue genetic testing. A saliva kit was mailed to Beverly Munoz and the sample will be sent to [facility] for analysis of the Multi-Cancer Panel. Results should be available within approximately 2-3 weeks' time, at which point they will be disclosed by telephone to Beverly Munoz, as will any additional recommendations warranted by these results. Beverly Munoz will receive a summary of her genetic counseling visit and a copy of her results once available. This information will also be available in Epic.  Beverly Munoz questions were answered to her satisfaction today. Our contact information was provided should additional questions or concerns arise. Thank you for the referral and allowing [place] to share in the care of your patient.   Faith Rogue, MS, [facility] Genetic Counselor [email or web address] Phone: [phone]  The patient was seen for a total of 30 minutes in  face-to-face genetic counseling.  Dr. Grayland Ormond was available for discussion regarding this case.   _______________________________________________________________________ For Office Staff:  Number of people involved in session: 1 Was an Intern/ student involved with case: no

## 2020-01-11 ENCOUNTER — Telehealth: Payer: Self-pay | Admitting: Licensed Clinical Social Worker

## 2020-01-16 ENCOUNTER — Encounter: Payer: Self-pay | Admitting: Licensed Clinical Social Worker

## 2020-01-16 ENCOUNTER — Ambulatory Visit: Payer: Self-pay | Admitting: Licensed Clinical Social Worker

## 2020-01-16 DIAGNOSIS — Z8042 Family history of malignant neoplasm of prostate: Secondary | ICD-10-CM

## 2020-01-16 DIAGNOSIS — Z803 Family history of malignant neoplasm of breast: Secondary | ICD-10-CM

## 2020-01-16 DIAGNOSIS — Z806 Family history of leukemia: Secondary | ICD-10-CM

## 2020-01-16 DIAGNOSIS — Z1379 Encounter for other screening for genetic and chromosomal anomalies: Secondary | ICD-10-CM | POA: Insufficient documentation

## 2020-01-16 DIAGNOSIS — N6099 Unspecified benign mammary dysplasia of unspecified breast: Secondary | ICD-10-CM

## 2020-01-16 NOTE — Progress Notes (Signed)
HPI:  [name] was previously seen in the [facility] due to a personal history of ADH, family history of cancer and concerns regarding a hereditary predisposition to cancer. Please refer to our prior cancer genetics clinic note for more information regarding our discussion, assessment and recommendations, at the time. [name] recent genetic test results were disclosed to her, as were recommendations warranted by these results. These results and recommendations are discussed in more detail below.  CANCER HISTORY:  Oncology History   No history exists.    FAMILY HISTORY:  We obtained a detailed, 4-generation family history.  Significant diagnoses are listed below: Family History  Problem Relation Age of Onset  . Breast cancer Mother        dx 57s  . Heart failure Father   . Prostate cancer Father        dx 50s  . Leukemia Paternal Grandmother   . Cancer Cousin        unk type d. 30s   [name] has twin daughter and son, age 58, no cancer for either. She has a brother and a sister, no cancers.   [name] mother had breast cancer in her 2s and is living at 58 Patient has 1 maternal aunt, no cancers. One maternal cousin died in his 30s of cancer, unknown type. Maternal grandparents passed in their 65s-90s.  [name] father had prostate cancer in his 104s and is living at 77. He also had something removed from his neck but patient is not sure if this was cancerous. Patient had 1 paternal uncle, no cancer. No cancer in cousins. Grandmother had leukemia. Paternal grandfather died of dementia in his 57s.  [name] is unaware of previous family history of genetic testing for hereditary cancer risks. Patient's maternal ancestors are of European descent, and paternal ancestors are of [place] descent. There is no reported Ashkenazi Jewish ancestry. There is no known consanguinity.     GENETIC TEST RESULTS: Genetic testing reported out on 01/10/2020 through  the [facility] Multi- cancer panel found no pathogenic mutations.   The Multi-Cancer Panel offered by [facility] includes sequencing and/or deletion duplication testing of the following 85 genes: AIP, ALK, APC, ATM, AXIN2,BAP1,  BARD1, BLM, BMPR1A, BRCA1, BRCA2, BRIP1, CASR, CDC73, CDH1, CDK4, CDKN1B, CDKN1C, CDKN2A (p14ARF), CDKN2A (p16INK4a), CEBPA, CHEK2, CTNNA1, DICER1, DIS3L2, EGFR (c.2369C>T, p.Thr790Met variant only), EPCAM (Deletion/duplication testing only), FH, FLCN, GATA2, GPC3, GREM1 (Promoter region deletion/duplication testing only), HOXB13 (c.251G>A, p.Gly84Glu), HRAS, KIT, MAX, MEN1, MET, MITF (c.952G>A, p.Glu318Lys variant only), MLH1, MSH2, MSH3, MSH6, MUTYH, NBN, NF1, NF2, NTHL1, PALB2, PDGFRA, PHOX2B, PMS2, POLD1, POLE, POT1, PRKAR1A, PTCH1, PTEN, RAD50, RAD51C, RAD51D, RB1, RECQL4, RET, RNF43, RUNX1, SDHAF2, SDHA (sequence changes only), SDHB, SDHC, SDHD, SMAD4, SMARCA4, SMARCB1, SMARCE1, STK11, SUFU, TERC, TERT, TMEM127, TP53, TSC1, TSC2, VHL, WRN and WT1.   The test report has been scanned into EPIC and is located under the Molecular Pathology section of the Results Review tab.  A portion of the result report is included below for reference.     We discussed with [name] that because current genetic testing is not perfect, it is possible there may be a gene mutation in one of these genes that current testing cannot detect, but that chance is small.  We also discussed, that there could be another gene that has not yet been discovered, or that we have not yet tested, that is responsible for the cancer diagnoses in the family. It is also possible  there is a hereditary cause for the cancer in the family that [name] did not inherit and therefore was not identified in her testing.  Therefore, it is important to remain in touch with cancer genetics in the future so that we can continue to offer [name] the most up to date genetic testing.   ADDITIONAL GENETIC TESTING: We discussed with [name] that her genetic testing was fairly extensive.  If there are genes identified to increase cancer risk that can be analyzed in the future, we would be happy to discuss and coordinate this testing at that time.    CANCER SCREENING RECOMMENDATIONS: [name] test result is considered negative (normal).  This means that we have not identified a hereditary cause for her family  history of cancer at this time.  While reassuring, this does not definitively rule out a hereditary predisposition to cancer. It is still possible that there could be genetic mutations that are undetectable by current technology. There could be genetic mutations in genes that have not been tested or identified to increase cancer risk.  Therefore, it is recommended she continue to follow the cancer management and screening guidelines provided by her primary healthcare provider.   An individual's cancer risk and medical management are not determined by genetic test results alone. Overall cancer risk assessment incorporates additional factors, including personal medical history, family history, and any available genetic information that may result in a personalized plan for cancer prevention and surveillance.  Based on Beverly Munoz's personal and family history of cancer as well as her genetic test results, risk model Harriett Rush was used to estimate her risk of developing breast cancer. This estimates her lifetime risk of developing breast cancer to be approximately 24%.  The patient's lifetime breast cancer risk is a preliminary estimate based on available information using one of several models endorsed by the [place] (ACS). The ACS recommends consideration of breast MRI screening as an adjunct to mammography for patients at high risk (defined as 20% or greater lifetime risk).     We therefore discussed that it is reasonable for Beverly Munoz to be followed by a high-risk breast cancer clinic; in addition to a yearly  mammogram and physical exam by a healthcare provider, she should discuss the usefulness of an annual breast MRI with the high-risk clinic providers. She would like to discuss with her GYN at [facility].   RECOMMENDATIONS FOR FAMILY MEMBERS:  Relatives in this family might be at some increased risk of developing cancer, over the general population risk, simply due to the family history of cancer.  We recommended female relatives in this family have a yearly mammogram beginning at age 39, or 35 years younger than the earliest onset of cancer, an annual clinical breast exam, and perform monthly breast self-exams. Female relatives in this family should also have a gynecological exam as recommended by their primary provider.  All family members should be referred for colonoscopy starting at age 90.   FOLLOW-UP: Lastly, we discussed with [name] that cancer genetics is a rapidly advancing field and it is possible that new genetic tests will be appropriate for her and/or her family members in the future. We encouraged her to remain in contact with cancer genetics on an annual basis so we can update her personal and family histories and let her know of advances in cancer genetics that may benefit this family.   Our contact number was provided. [name] questions were answered  to her satisfaction, and she knows she is welcome to call us at anytime with additional questions or concerns.   Faith Rogue, MS, [facility] Genetic Counselor [email or web address] Phone: [phone]

## 2020-01-16 NOTE — Telephone Encounter (Signed)
Revealed negative genetic testing.   This normal result is reassuring. It is unlikely that there is an increased risk of cancer due to a mutation in one of these genes.  However, genetic testing is not perfect, and cannot definitively rule out a hereditary cause.  It will be important for her to keep in contact with genetics to learn if any additional testing may be needed in the future.   Additionally, we discussed that her lifetime risk per Harriett Rush is 24% which would qualify her for breast MRIs.

## 2020-10-31 ENCOUNTER — Other Ambulatory Visit: Payer: Self-pay | Admitting: Obstetrics and Gynecology

## 2020-10-31 DIAGNOSIS — N6092 Unspecified benign mammary dysplasia of left breast: Secondary | ICD-10-CM

## 2020-11-06 ENCOUNTER — Other Ambulatory Visit: Payer: Self-pay | Admitting: Obstetrics and Gynecology

## 2020-11-06 DIAGNOSIS — M8580 Other specified disorders of bone density and structure, unspecified site: Secondary | ICD-10-CM

## 2020-11-08 ENCOUNTER — Other Ambulatory Visit: Payer: Self-pay

## 2020-11-08 ENCOUNTER — Ambulatory Visit
Admission: RE | Admit: 2020-11-08 | Discharge: 2020-11-08 | Disposition: A | Discharge status: Home / Self Care | Payer: No Typology Code available for payment source | Source: Ambulatory Visit | Attending: Obstetrics and Gynecology | Admitting: Obstetrics and Gynecology

## 2020-11-08 DIAGNOSIS — M8580 Other specified disorders of bone density and structure, unspecified site: Secondary | ICD-10-CM

## 2020-11-26 ENCOUNTER — Ambulatory Visit
Admission: RE | Admit: 2020-11-26 | Discharge: 2020-11-26 | Disposition: A | Discharge status: Home / Self Care | Payer: No Typology Code available for payment source | Source: Ambulatory Visit | Attending: Obstetrics and Gynecology | Admitting: Obstetrics and Gynecology

## 2020-11-26 DIAGNOSIS — N6092 Unspecified benign mammary dysplasia of left breast: Secondary | ICD-10-CM

## 2020-11-26 MED ORDER — GADOBUTROL 1 MMOL/ML IV SOLN
8.0000 mL | Freq: Once | INTRAVENOUS | Status: AC | PRN
  Administered 2020-11-26: 8 mL via INTRAVENOUS

## 2021-01-04 IMAGING — MG MM DIGITAL DIAGNOSTIC UNILAT*L* W/ TOMO W/ CAD
6 series · 6 of 18 positions shown · non-contrast
Comparison: Previous exam(s).

CLINICAL DATA: Patient presents for evaluation of intermittent
yellowish and clear discharge from the left nipple over the last 3
weeks. Patient states that the discharge is both spontaneous and
with expression.

EXAM:
DIGITAL DIAGNOSTIC LEFT MAMMOGRAM WITH CAD AND TOMO
ULTRASOUND LEFT BREAST

[L CC synth-2D]
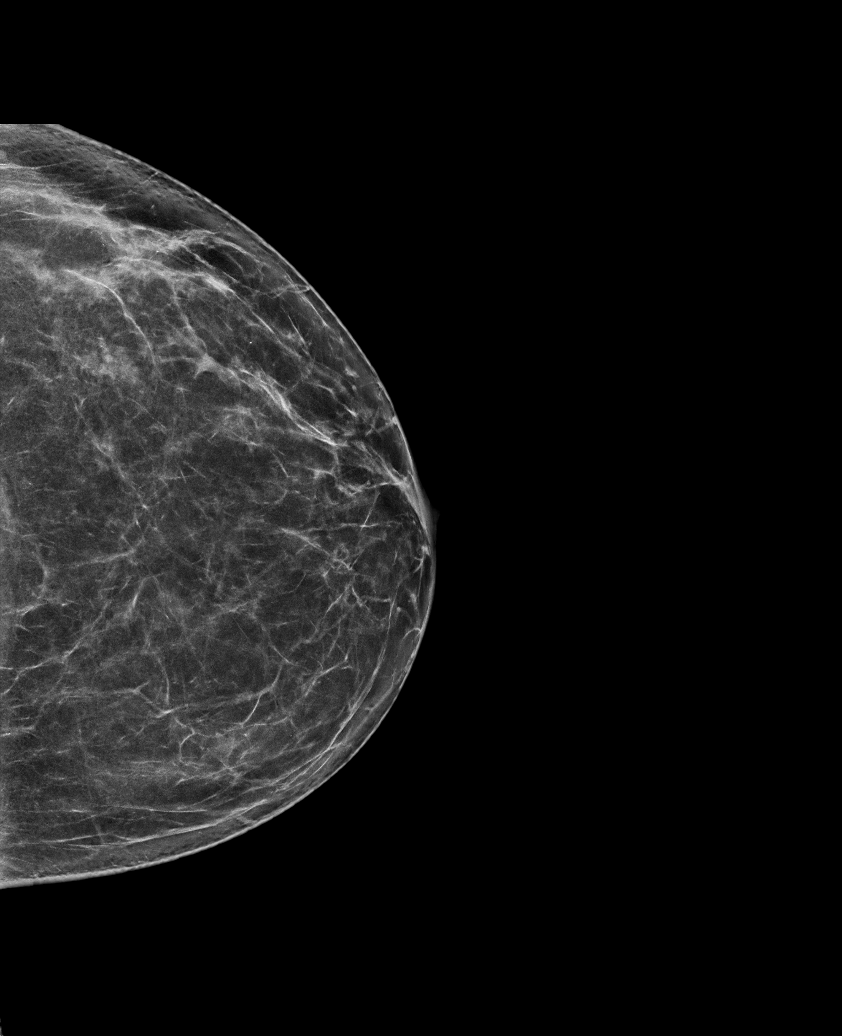

[L MLO synth-2D (1 of 2)]
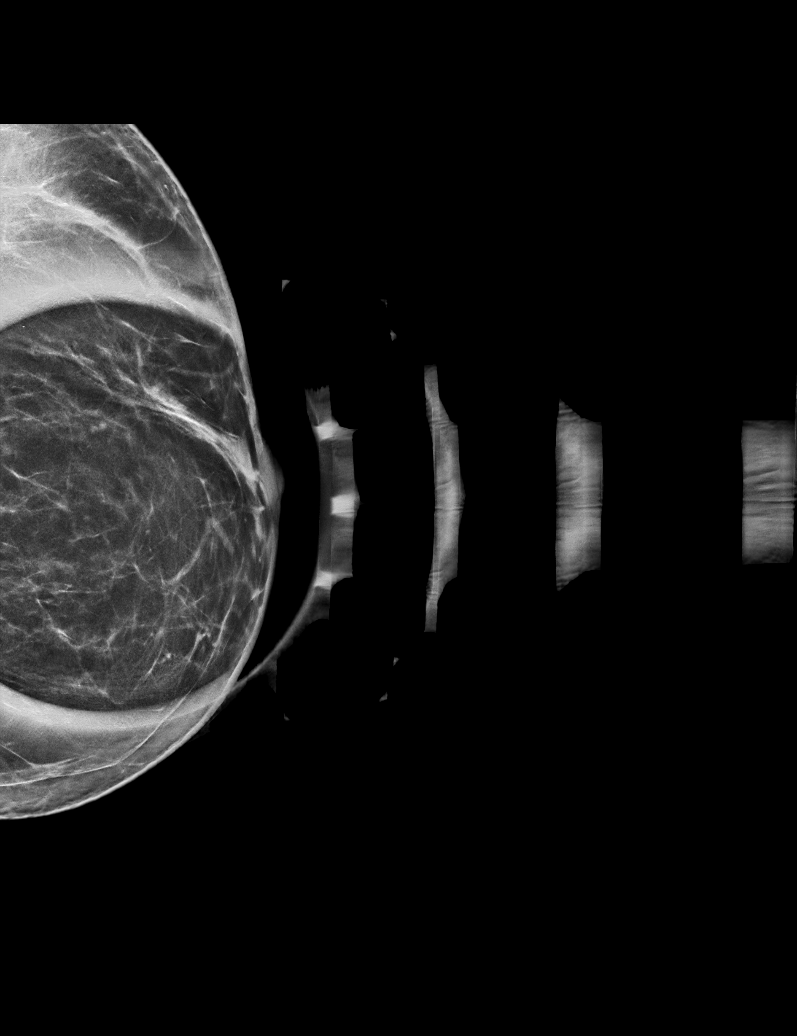

[L MLO synth-2D (2 of 2)]
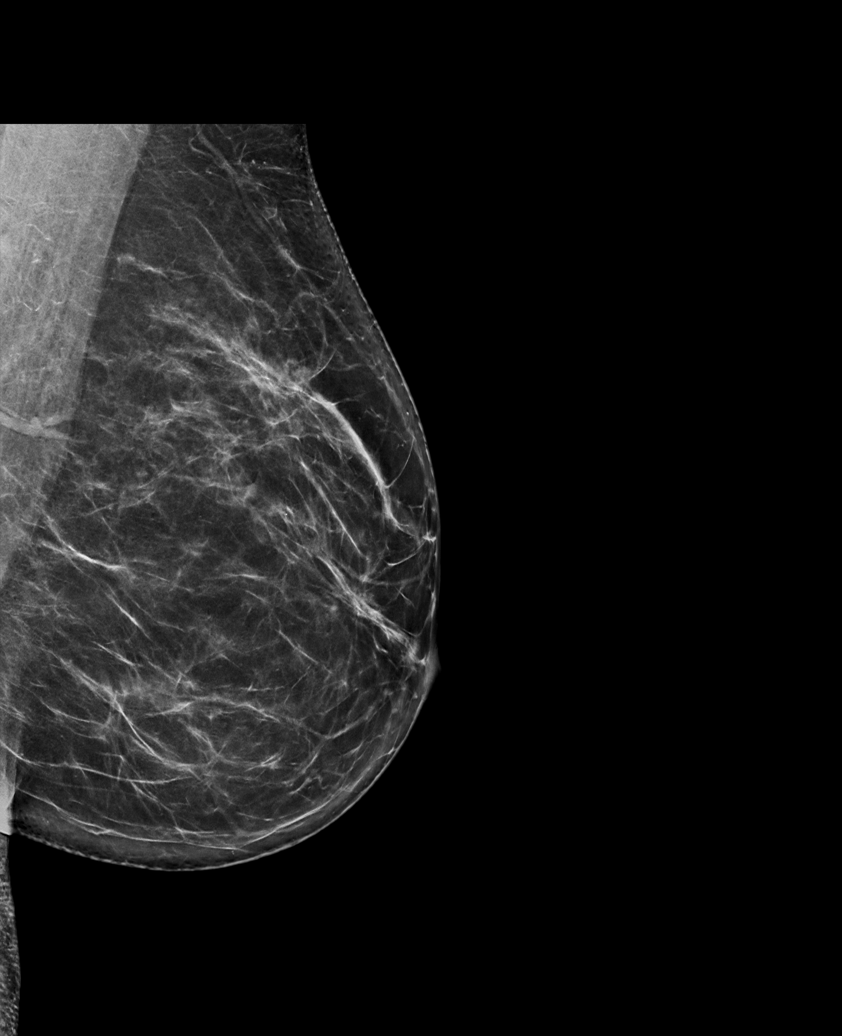

[L MLO tomo (1 of 2) · tomo slice 40/79.0]
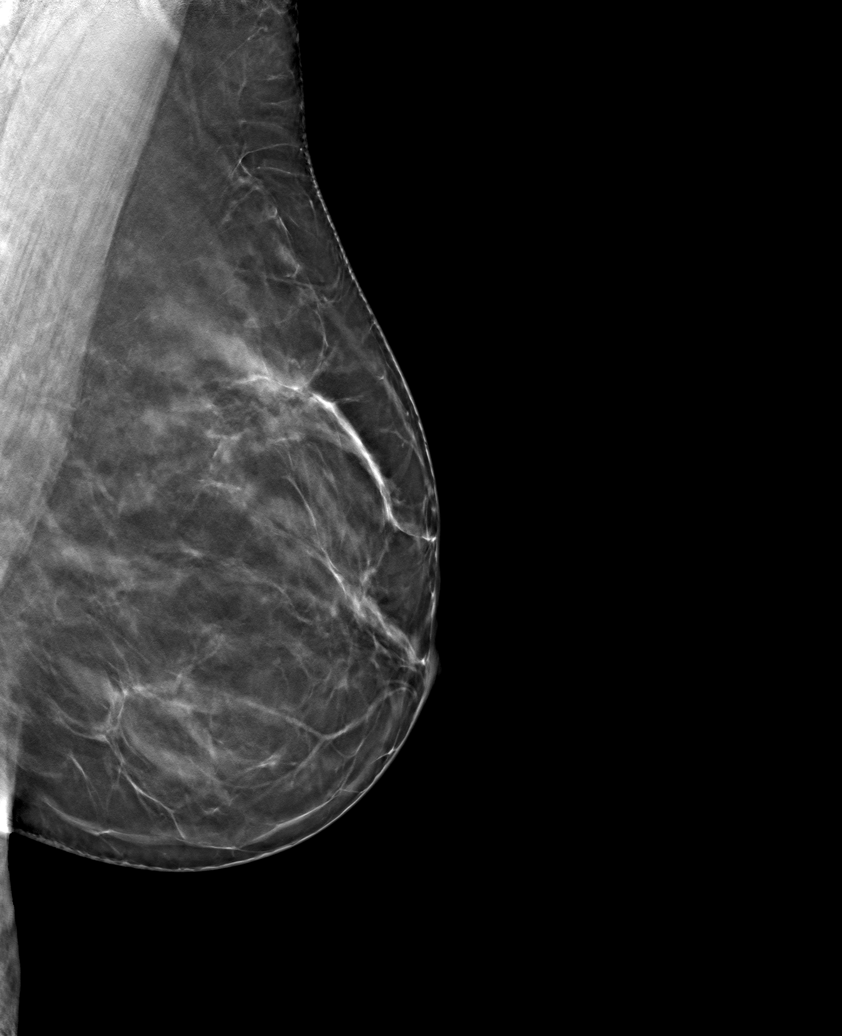

[L MLO tomo (2 of 2) · tomo slice 33/64.0]
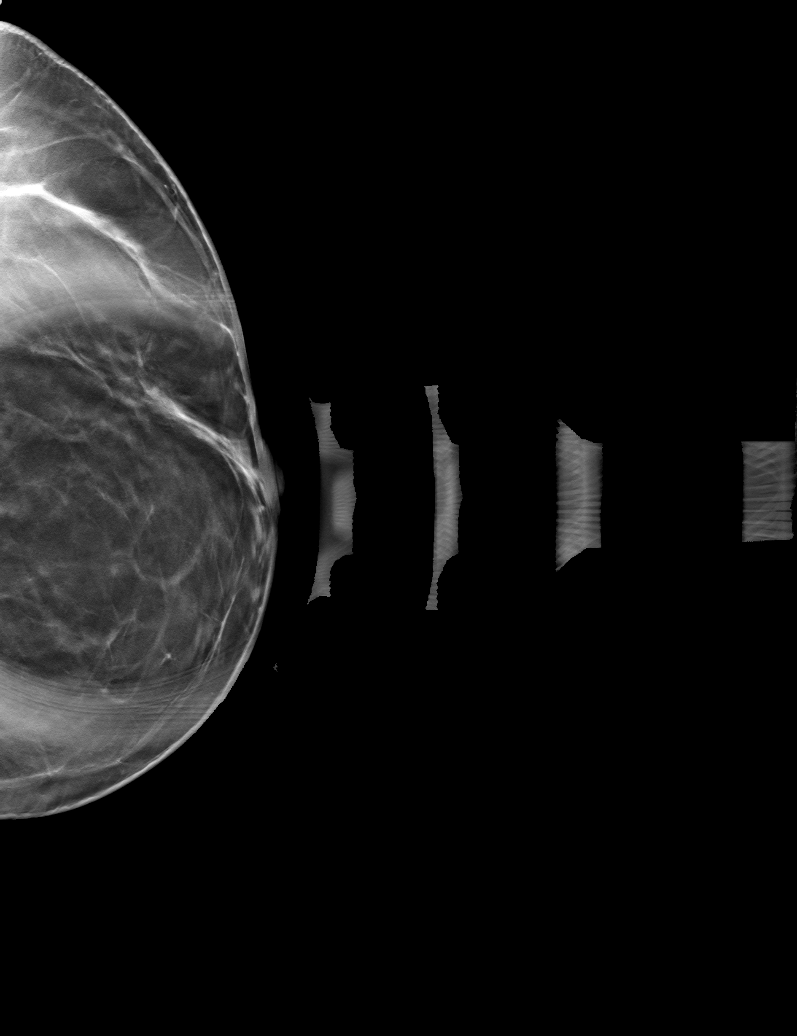

[L CC tomo · tomo slice 37/74.0]
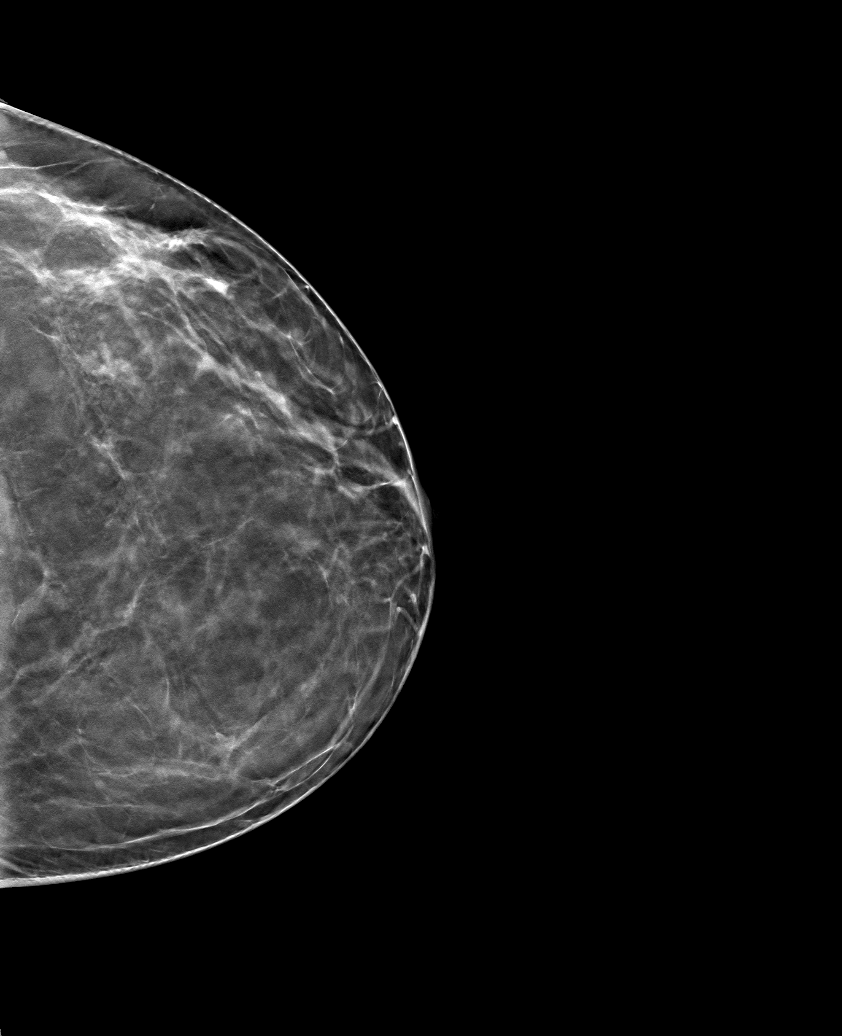

[6 of 18 positions shown; findings below may reference images not displayed]

ACR Breast Density Category b: There are scattered areas of
fibroglandular density.
FINDINGS: Within the outer aspect of the left breast middle depth there is a
small asymmetry. No additional masses, calcifications or nonsurgical
distortion identified within the left breast. Postsurgical changes
of the superior left breast on the MLO view.

Mammographic images were processed with CAD.

On physical exam, small amount of clear discharge is elicited with
palpation.

Targeted ultrasound is performed, showing a mildly dilated duct
within the retroareolar left breast 3 o'clock position. Within the
more peripheral aspect of the duct there is a 19 x 2 x 7 mm
intraductal mass left breast 3 o'clock position 5 cm from nipple.
This likely corresponds with the asymmetry identified in the outer
left breast on mammography.

No left axillary adenopathy.
IMPRESSION: Intraductal mass within the left breast 3 o'clock position. This
likely corresponds with the asymmetry identified within the outer
left breast on mammography.

RECOMMENDATION:
Ultrasound-guided core needle biopsy intraductal mass left breast.

I have discussed the findings and recommendations with the patient.
If applicable, a reminder letter will be sent to the patient
regarding the next appointment.

BI-RADS CATEGORY  4: Suspicious.

## 2021-01-12 IMAGING — US US BREAST BX W LOC DEV 1ST LESION IMG BX SPEC US GUIDE*L*
1 series · 12 of 12 positions shown · non-contrast
Comparison: Previous exam(s).
COMPARISON: Previous exam(s).

Addendum:
CLINICAL DATA: 58-year-old female presents for ultrasound-guided
core biopsy of a 1.9 cm left breast intraductal mass at the 3
o'clock position.

EXAM:
ULTRASOUND GUIDED LEFT BREAST CORE NEEDLE BIOPSY

[Series 1: us breast bx w loc dev 1st lesion img bx spec us g · 0.07mm/px · 12 of 12 slices shown]
[im 1/12]
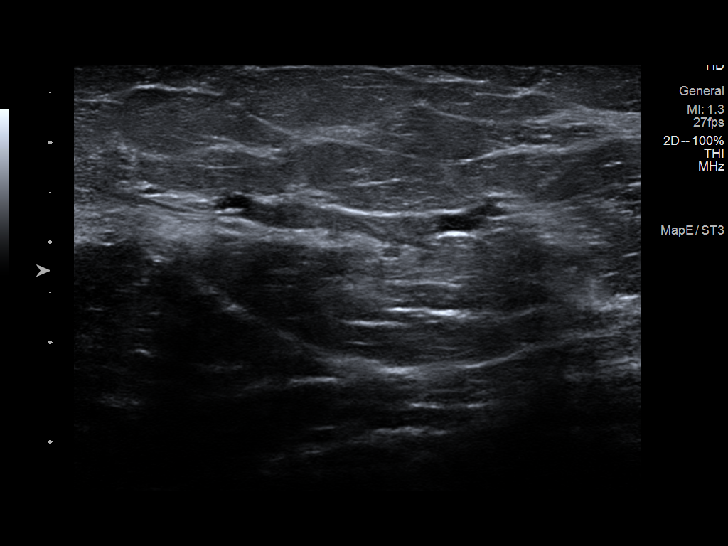
[im 2/12]
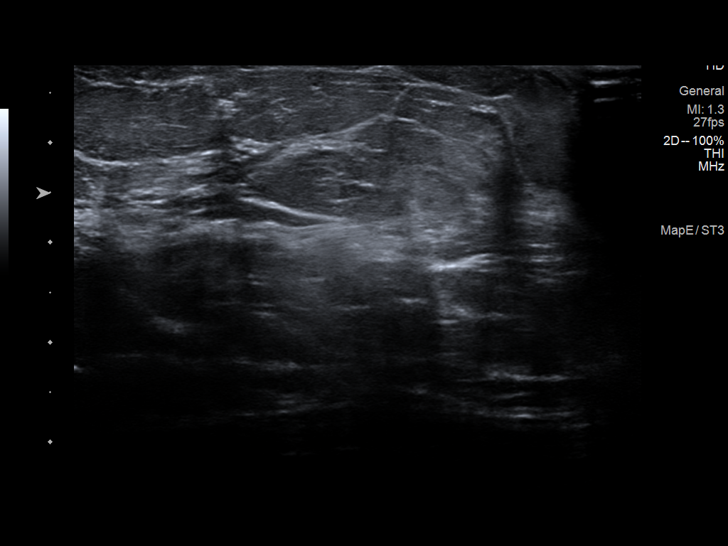
[im 3/12]
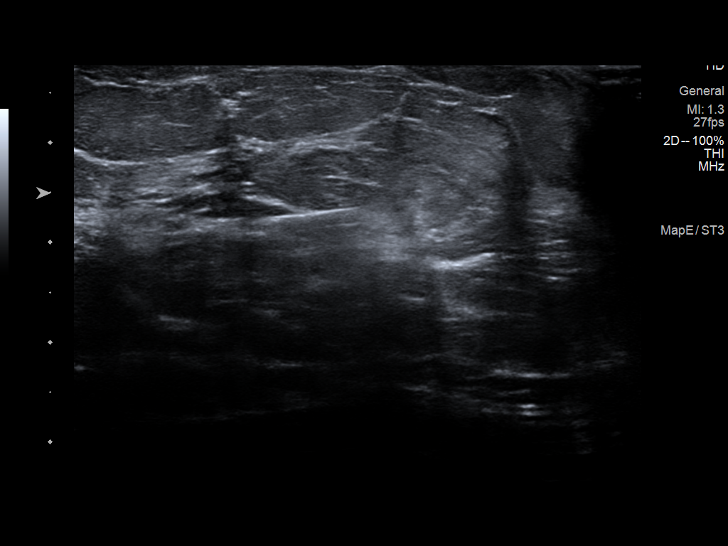
[im 4/12]
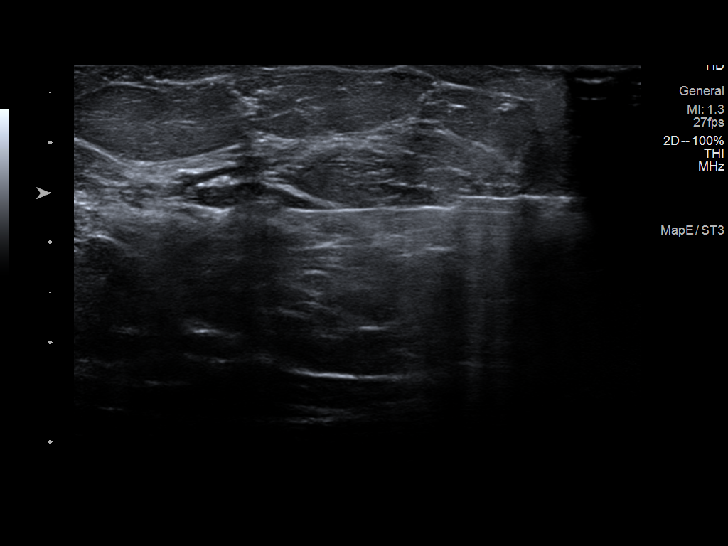
[im 5/12]
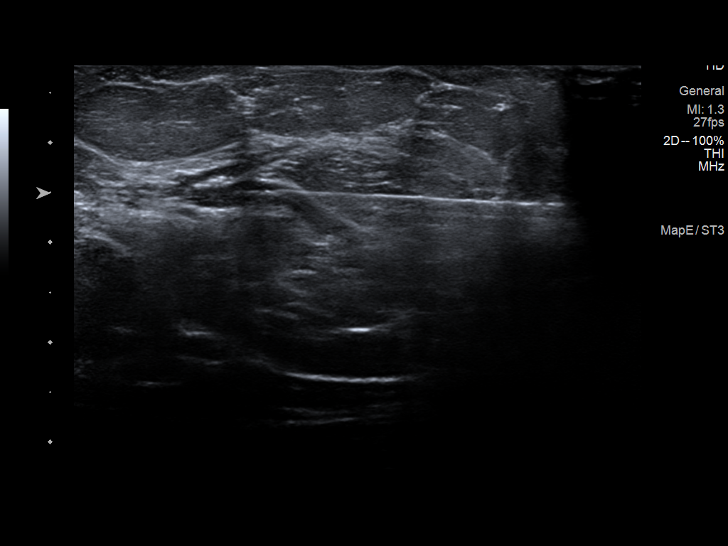
[im 6/12]
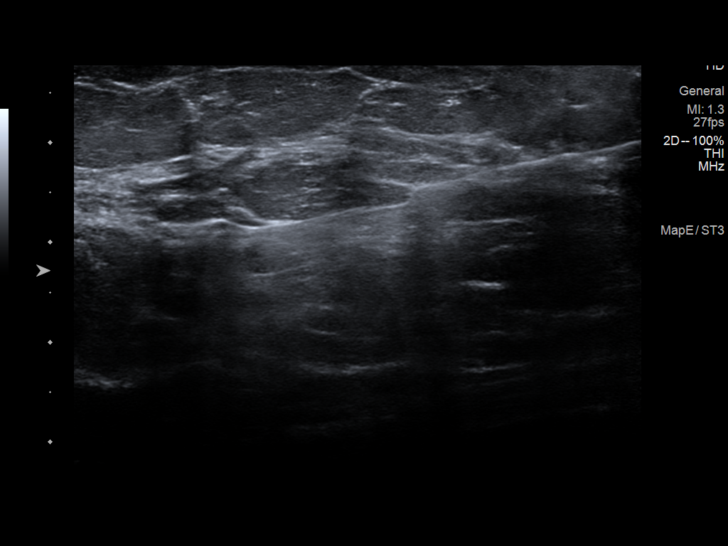
[im 7/12]
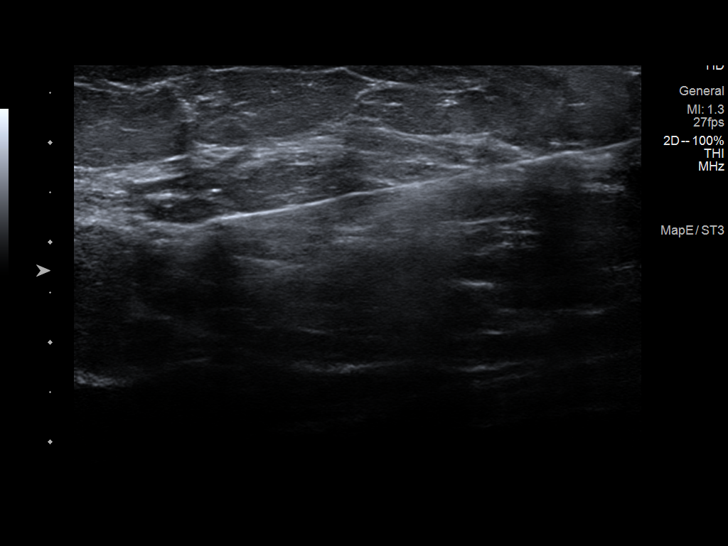
[im 8/12]
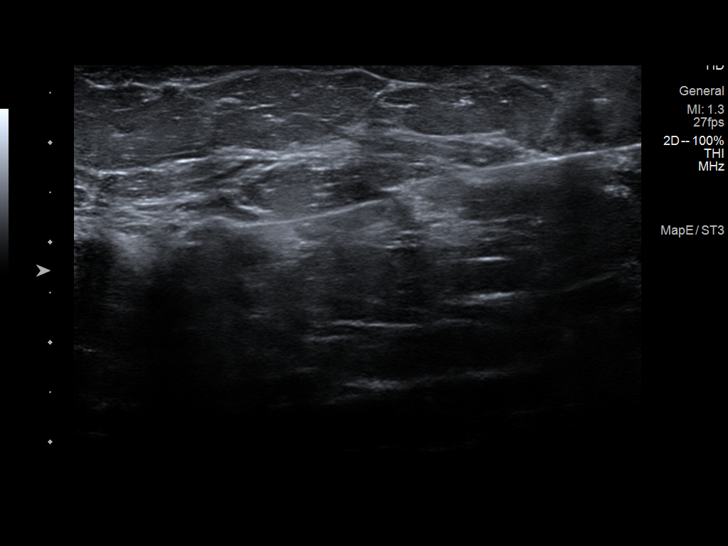
[im 9/12]
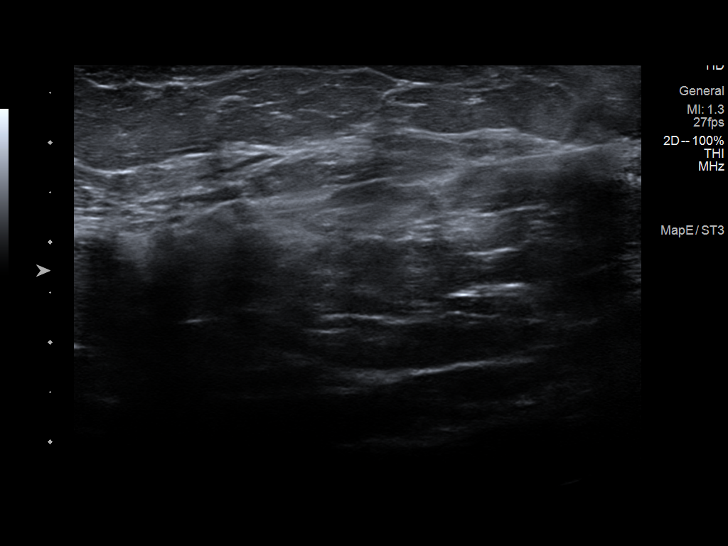
[im 10/12]
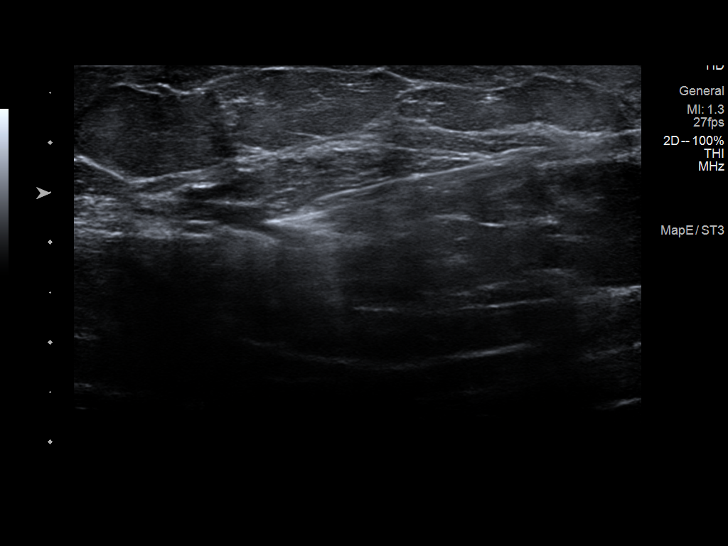
[im 11/12]
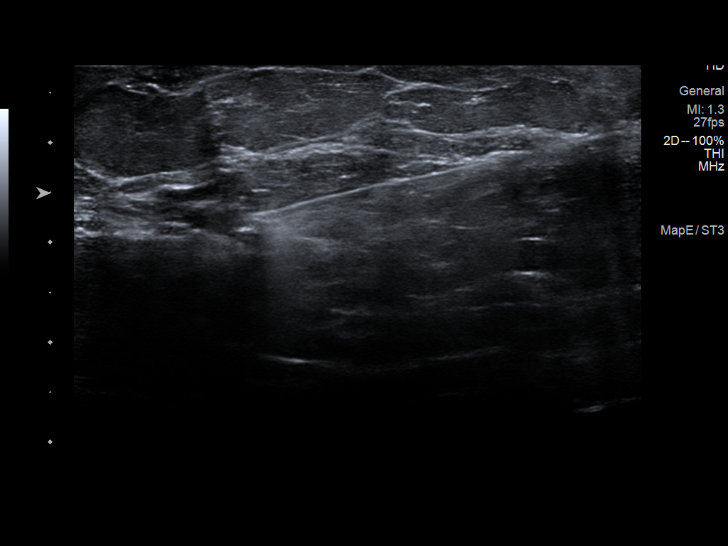
[im 12/12]
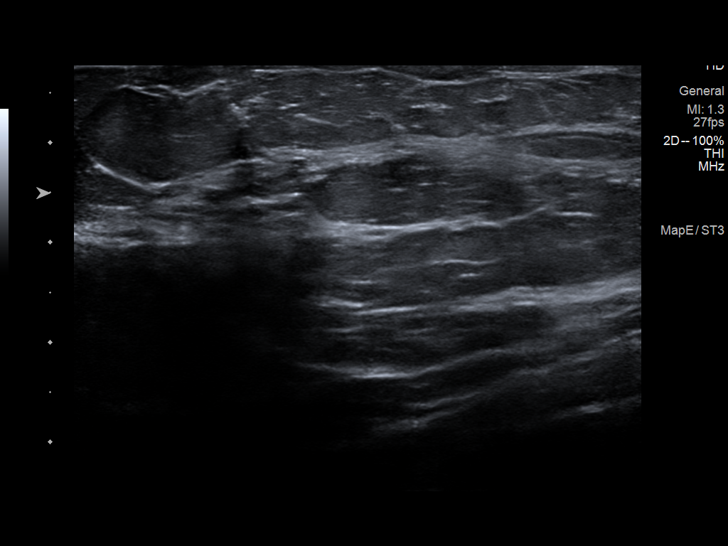

[12 of 12 positions shown; findings below may reference images not displayed]



Lesion quadrant: Lower outer

Using sterile technique and 1% Lidocaine as local anesthetic, under
direct ultrasound visualization, a 14 gauge Sagaria device was
used to perform biopsy of the mass in the left breast at the 3
o'clock position using a lateral to medial approach. At the
conclusion of the procedure a ribbon shaped tissue marker clip was
deployed into the biopsy cavity. Follow up 2 view mammogram was
performed and dictated separately.
IMPRESSION: Ultrasound guided biopsy of the intraductal mass in the left breast
at the 3 o'clock position. No apparent complications.

ADDENDUM:
Pathology revealed INTRADUCTAL PAPILLOMA WITH ATYPICAL DUCTAL
HYPERPLASIA of the Left breast, 3 o'clock. This was found to be
concordant by Dr. Roberto Tiger, with excision recommended.

Pathology results were discussed with the patient by telephone. The
patient reported doing well after the biopsy with tenderness at the
site. Post biopsy instructions and care were reviewed and questions
were answered. The patient was encouraged to call The [REDACTED] for any additional concerns. My direct phone
number was provided.

Surgical consultation has been arranged with Dr. Siqi Lerner at
[REDACTED] on October 10, 2019.

Pathology results reported by Jornis Benedetti, RN on 09/16/2019.



Lesion quadrant: Lower outer

Using sterile technique and 1% Lidocaine as local anesthetic, under
direct ultrasound visualization, a 14 gauge Sagaria device was
used to perform biopsy of the mass in the left breast at the 3
o'clock position using a lateral to medial approach. At the
conclusion of the procedure a ribbon shaped tissue marker clip was
deployed into the biopsy cavity. Follow up 2 view mammogram was
performed and dictated separately.
IMPRESSION: Ultrasound guided biopsy of the intraductal mass in the left breast
at the 3 o'clock position. No apparent complications.

## 2021-03-22 IMAGING — DX MM BREAST SURGICAL SPECIMEN
1 series · 2 of 2 positions shown · non-contrast
Comparison: Previous exam(s).

CLINICAL DATA: Status post excisional biopsy of the left breast.

EXAM:
SPECIMEN RADIOGRAPH OF THE LEFT BREAST

[Series 1: specimen digital x-ray · left · 0.07mm/px · 2 of 2 slices shown]
[im 1/2]
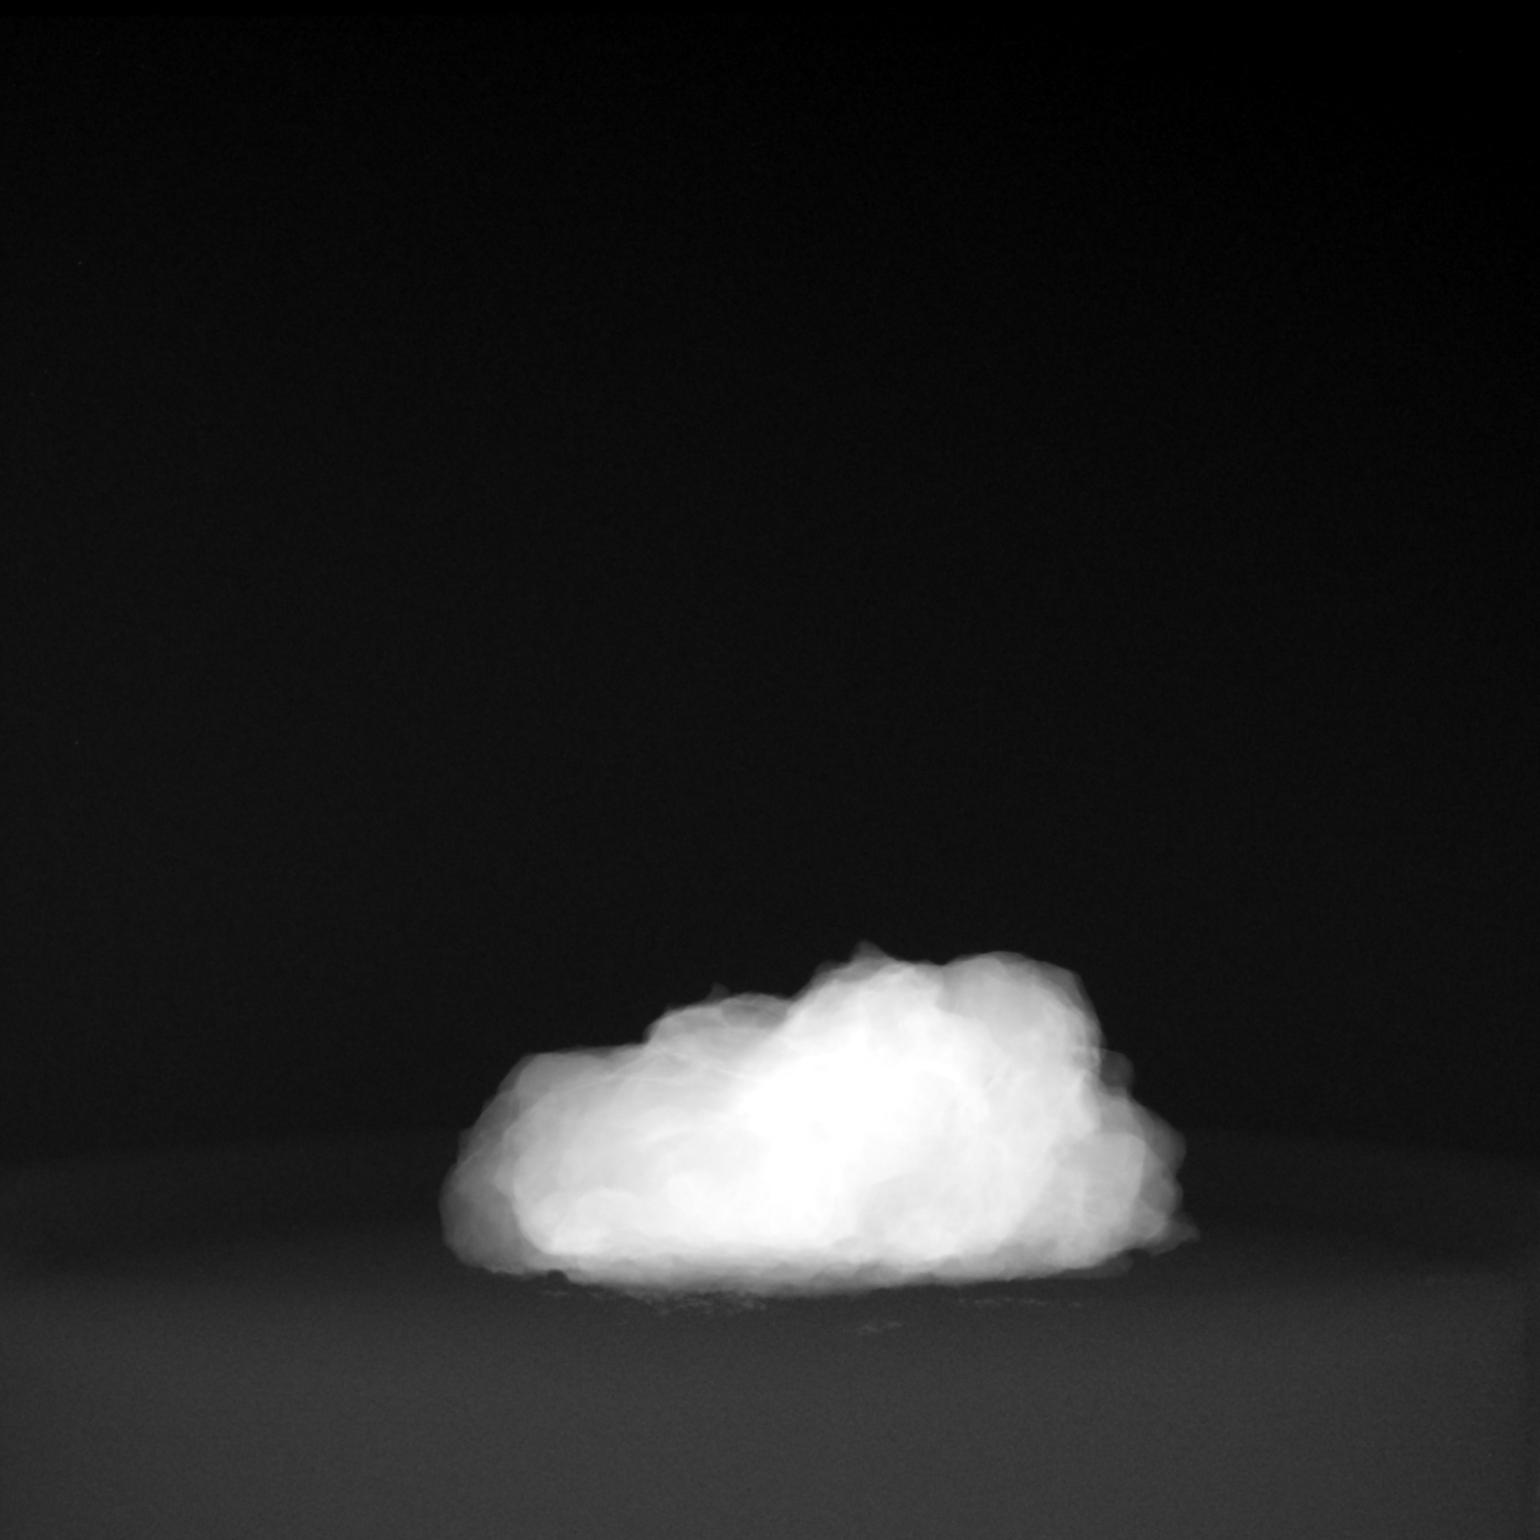
[im 2/2]
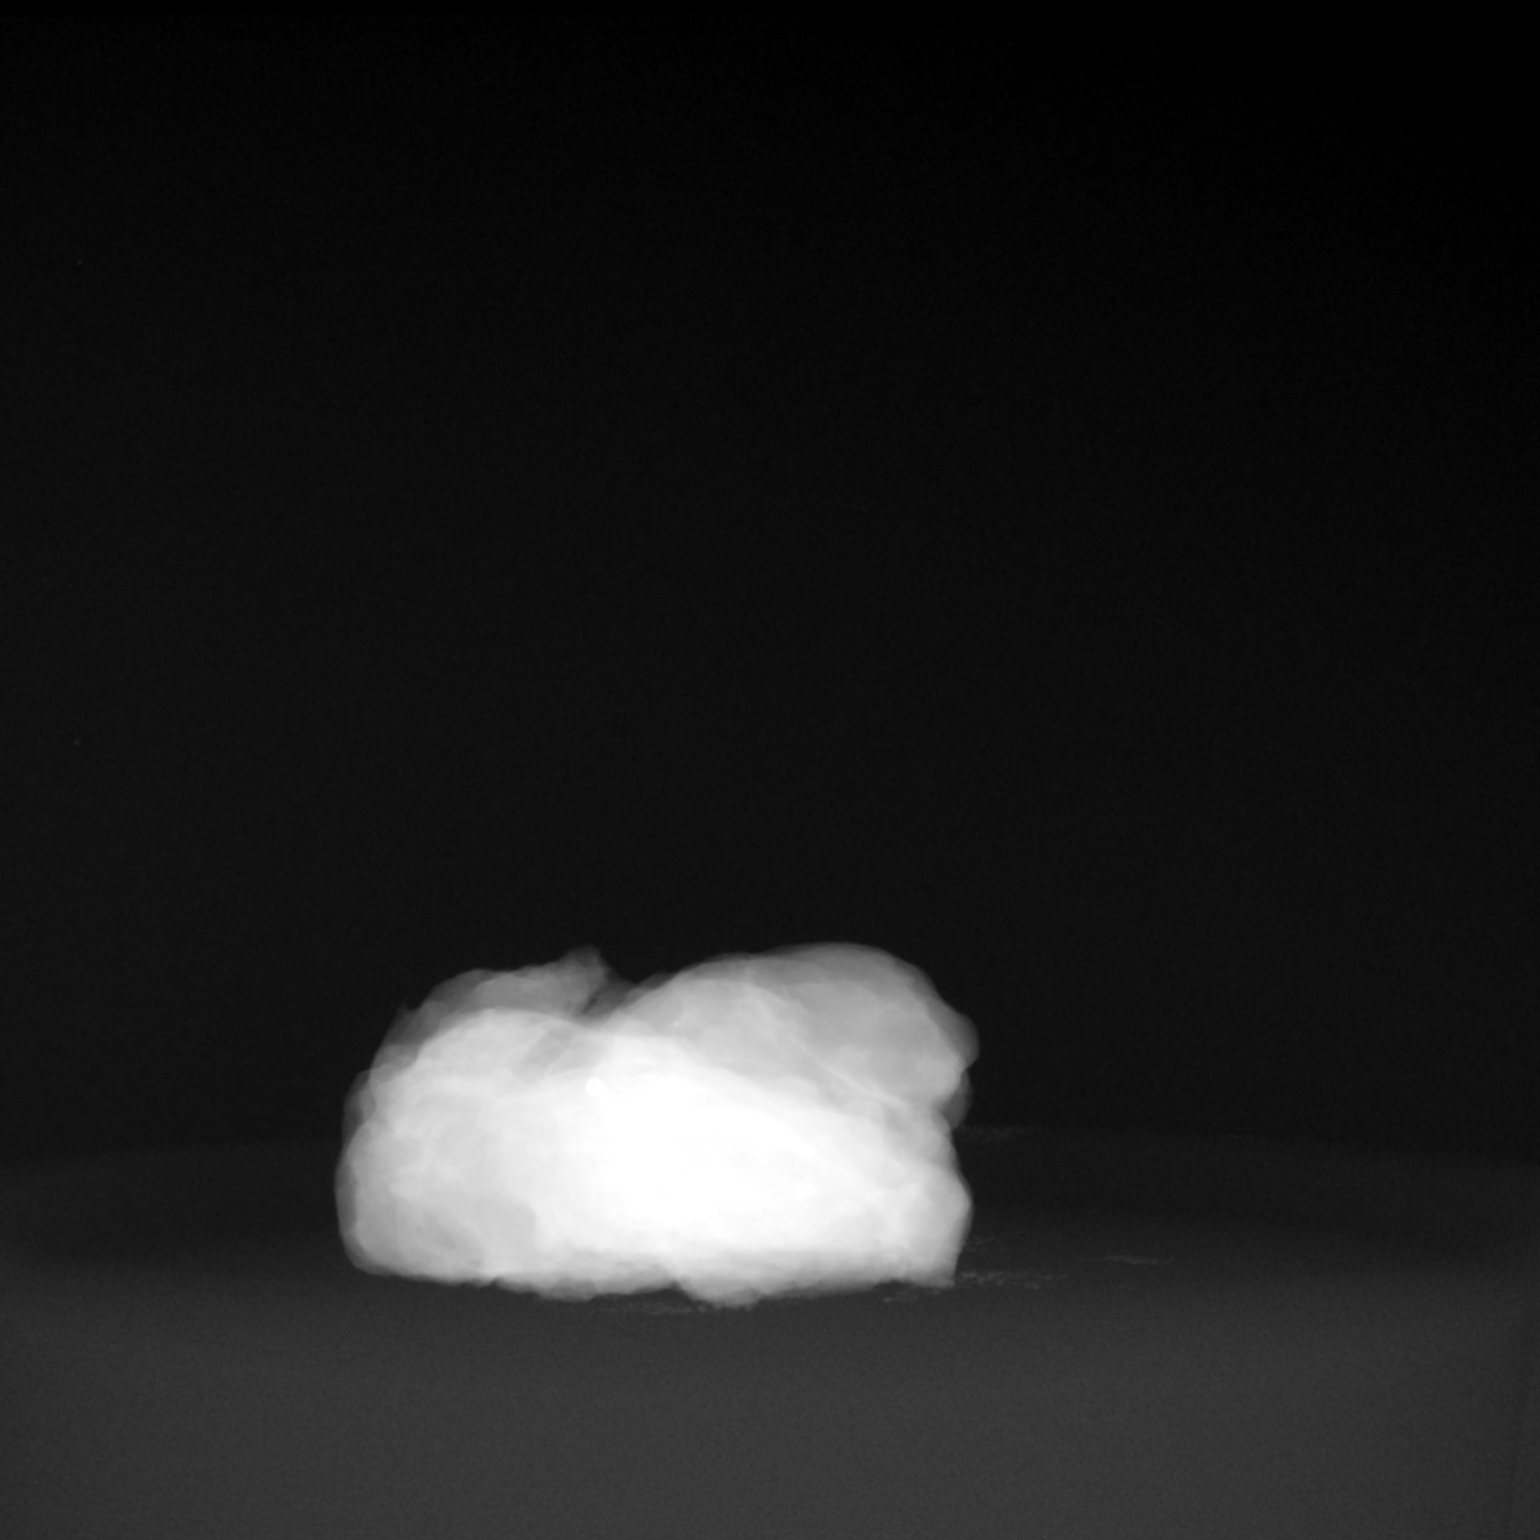

[2 of 2 positions shown; findings below may reference images not displayed]

FINDINGS: Status post excision of the left breast. The radioactive seed and
biopsy marker clip are present, completely intact, and were marked
for pathology.
IMPRESSION: Specimen radiograph of the left breast.

## 2021-03-28 ENCOUNTER — Encounter (HOSPITAL_COMMUNITY): Payer: Self-pay

## 2021-10-02 DIAGNOSIS — Z0001 Encounter for general adult medical examination with abnormal findings: Secondary | ICD-10-CM | POA: Diagnosis not present

## 2021-10-02 DIAGNOSIS — E663 Overweight: Secondary | ICD-10-CM | POA: Diagnosis not present

## 2021-10-02 DIAGNOSIS — M8589 Other specified disorders of bone density and structure, multiple sites: Secondary | ICD-10-CM | POA: Diagnosis not present

## 2021-10-02 DIAGNOSIS — G4700 Insomnia, unspecified: Secondary | ICD-10-CM | POA: Diagnosis not present

## 2021-10-18 DIAGNOSIS — M25422 Effusion, left elbow: Secondary | ICD-10-CM | POA: Diagnosis not present

## 2021-10-18 DIAGNOSIS — M778 Other enthesopathies, not elsewhere classified: Secondary | ICD-10-CM | POA: Diagnosis not present

## 2021-11-12 DIAGNOSIS — F4323 Adjustment disorder with mixed anxiety and depressed mood: Secondary | ICD-10-CM | POA: Diagnosis not present

## 2021-11-20 DIAGNOSIS — M25522 Pain in left elbow: Secondary | ICD-10-CM | POA: Diagnosis not present

## 2021-11-20 DIAGNOSIS — M7021 Olecranon bursitis, right elbow: Secondary | ICD-10-CM | POA: Diagnosis not present

## 2021-12-03 DIAGNOSIS — F4323 Adjustment disorder with mixed anxiety and depressed mood: Secondary | ICD-10-CM | POA: Diagnosis not present

## 2021-12-09 DIAGNOSIS — F4323 Adjustment disorder with mixed anxiety and depressed mood: Secondary | ICD-10-CM | POA: Diagnosis not present

## 2021-12-17 DIAGNOSIS — F4323 Adjustment disorder with mixed anxiety and depressed mood: Secondary | ICD-10-CM | POA: Diagnosis not present

## 2021-12-25 DIAGNOSIS — F4323 Adjustment disorder with mixed anxiety and depressed mood: Secondary | ICD-10-CM | POA: Diagnosis not present

## 2022-01-06 DIAGNOSIS — F4323 Adjustment disorder with mixed anxiety and depressed mood: Secondary | ICD-10-CM | POA: Diagnosis not present

## 2022-01-13 DIAGNOSIS — Z01419 Encounter for gynecological examination (general) (routine) without abnormal findings: Secondary | ICD-10-CM | POA: Diagnosis not present

## 2022-01-13 DIAGNOSIS — Z1231 Encounter for screening mammogram for malignant neoplasm of breast: Secondary | ICD-10-CM | POA: Diagnosis not present

## 2022-01-13 DIAGNOSIS — Z6830 Body mass index (BMI) 30.0-30.9, adult: Secondary | ICD-10-CM | POA: Diagnosis not present

## 2022-01-15 DIAGNOSIS — F4323 Adjustment disorder with mixed anxiety and depressed mood: Secondary | ICD-10-CM | POA: Diagnosis not present

## 2022-01-31 DIAGNOSIS — F4323 Adjustment disorder with mixed anxiety and depressed mood: Secondary | ICD-10-CM | POA: Diagnosis not present

## 2022-02-24 DIAGNOSIS — F4323 Adjustment disorder with mixed anxiety and depressed mood: Secondary | ICD-10-CM | POA: Diagnosis not present

## 2022-03-11 DIAGNOSIS — F4323 Adjustment disorder with mixed anxiety and depressed mood: Secondary | ICD-10-CM | POA: Diagnosis not present

## 2022-03-25 DIAGNOSIS — F4323 Adjustment disorder with mixed anxiety and depressed mood: Secondary | ICD-10-CM | POA: Diagnosis not present

## 2022-04-08 DIAGNOSIS — F4323 Adjustment disorder with mixed anxiety and depressed mood: Secondary | ICD-10-CM | POA: Diagnosis not present

## 2022-04-22 DIAGNOSIS — F4323 Adjustment disorder with mixed anxiety and depressed mood: Secondary | ICD-10-CM | POA: Diagnosis not present

## 2022-04-23 DIAGNOSIS — Z86010 Personal history of colonic polyps: Secondary | ICD-10-CM | POA: Diagnosis not present

## 2022-04-23 DIAGNOSIS — Z1211 Encounter for screening for malignant neoplasm of colon: Secondary | ICD-10-CM | POA: Diagnosis not present

## 2022-04-23 DIAGNOSIS — D122 Benign neoplasm of ascending colon: Secondary | ICD-10-CM | POA: Diagnosis not present

## 2022-04-23 DIAGNOSIS — D123 Benign neoplasm of transverse colon: Secondary | ICD-10-CM | POA: Diagnosis not present

## 2022-04-23 DIAGNOSIS — Z09 Encounter for follow-up examination after completed treatment for conditions other than malignant neoplasm: Secondary | ICD-10-CM | POA: Diagnosis not present

## 2022-05-06 DIAGNOSIS — F4323 Adjustment disorder with mixed anxiety and depressed mood: Secondary | ICD-10-CM | POA: Diagnosis not present

## 2022-05-22 DIAGNOSIS — F4323 Adjustment disorder with mixed anxiety and depressed mood: Secondary | ICD-10-CM | POA: Diagnosis not present

## 2022-05-29 DIAGNOSIS — F4323 Adjustment disorder with mixed anxiety and depressed mood: Secondary | ICD-10-CM | POA: Diagnosis not present

## 2022-06-10 DIAGNOSIS — F4323 Adjustment disorder with mixed anxiety and depressed mood: Secondary | ICD-10-CM | POA: Diagnosis not present

## 2022-06-17 DIAGNOSIS — F4323 Adjustment disorder with mixed anxiety and depressed mood: Secondary | ICD-10-CM | POA: Diagnosis not present

## 2022-07-03 DIAGNOSIS — F4323 Adjustment disorder with mixed anxiety and depressed mood: Secondary | ICD-10-CM | POA: Diagnosis not present

## 2022-07-15 DIAGNOSIS — F4323 Adjustment disorder with mixed anxiety and depressed mood: Secondary | ICD-10-CM | POA: Diagnosis not present

## 2022-08-05 DIAGNOSIS — F4323 Adjustment disorder with mixed anxiety and depressed mood: Secondary | ICD-10-CM | POA: Diagnosis not present

## 2022-09-09 DIAGNOSIS — F4323 Adjustment disorder with mixed anxiety and depressed mood: Secondary | ICD-10-CM | POA: Diagnosis not present

## 2022-09-22 DIAGNOSIS — M7752 Other enthesopathy of left foot: Secondary | ICD-10-CM | POA: Diagnosis not present

## 2022-09-22 DIAGNOSIS — M722 Plantar fascial fibromatosis: Secondary | ICD-10-CM | POA: Diagnosis not present

## 2022-09-22 DIAGNOSIS — M7751 Other enthesopathy of right foot: Secondary | ICD-10-CM | POA: Diagnosis not present

## 2022-10-08 DIAGNOSIS — F4323 Adjustment disorder with mixed anxiety and depressed mood: Secondary | ICD-10-CM | POA: Diagnosis not present

## 2022-10-14 DIAGNOSIS — D22122 Melanocytic nevi of left lower eyelid, including canthus: Secondary | ICD-10-CM | POA: Diagnosis not present

## 2022-10-14 DIAGNOSIS — L821 Other seborrheic keratosis: Secondary | ICD-10-CM | POA: Diagnosis not present

## 2022-10-14 DIAGNOSIS — L918 Other hypertrophic disorders of the skin: Secondary | ICD-10-CM | POA: Diagnosis not present

## 2022-10-23 DIAGNOSIS — M722 Plantar fascial fibromatosis: Secondary | ICD-10-CM | POA: Diagnosis not present

## 2022-10-23 DIAGNOSIS — M7751 Other enthesopathy of right foot: Secondary | ICD-10-CM | POA: Diagnosis not present

## 2022-11-04 DIAGNOSIS — F4323 Adjustment disorder with mixed anxiety and depressed mood: Secondary | ICD-10-CM | POA: Diagnosis not present

## 2022-11-06 DIAGNOSIS — Z13220 Encounter for screening for lipoid disorders: Secondary | ICD-10-CM | POA: Diagnosis not present

## 2022-11-06 DIAGNOSIS — Z0000 Encounter for general adult medical examination without abnormal findings: Secondary | ICD-10-CM | POA: Diagnosis not present

## 2022-12-08 DIAGNOSIS — F4323 Adjustment disorder with mixed anxiety and depressed mood: Secondary | ICD-10-CM | POA: Diagnosis not present

## 2022-12-16 DIAGNOSIS — F4323 Adjustment disorder with mixed anxiety and depressed mood: Secondary | ICD-10-CM | POA: Diagnosis not present

## 2023-01-01 DIAGNOSIS — F4323 Adjustment disorder with mixed anxiety and depressed mood: Secondary | ICD-10-CM | POA: Diagnosis not present

## 2023-01-13 DIAGNOSIS — F4323 Adjustment disorder with mixed anxiety and depressed mood: Secondary | ICD-10-CM | POA: Diagnosis not present

## 2023-01-29 ENCOUNTER — Other Ambulatory Visit: Payer: Self-pay | Admitting: Obstetrics and Gynecology

## 2023-01-29 DIAGNOSIS — Z01419 Encounter for gynecological examination (general) (routine) without abnormal findings: Secondary | ICD-10-CM | POA: Diagnosis not present

## 2023-01-29 DIAGNOSIS — M8580 Other specified disorders of bone density and structure, unspecified site: Secondary | ICD-10-CM

## 2023-01-29 DIAGNOSIS — Z1231 Encounter for screening mammogram for malignant neoplasm of breast: Secondary | ICD-10-CM | POA: Diagnosis not present

## 2023-01-29 DIAGNOSIS — Z1331 Encounter for screening for depression: Secondary | ICD-10-CM | POA: Diagnosis not present

## 2023-01-29 DIAGNOSIS — N62 Hypertrophy of breast: Secondary | ICD-10-CM

## 2023-02-03 DIAGNOSIS — F4323 Adjustment disorder with mixed anxiety and depressed mood: Secondary | ICD-10-CM | POA: Diagnosis not present

## 2023-02-19 DIAGNOSIS — F4323 Adjustment disorder with mixed anxiety and depressed mood: Secondary | ICD-10-CM | POA: Diagnosis not present

## 2023-03-03 DIAGNOSIS — F4323 Adjustment disorder with mixed anxiety and depressed mood: Secondary | ICD-10-CM | POA: Diagnosis not present

## 2023-04-13 DIAGNOSIS — F4323 Adjustment disorder with mixed anxiety and depressed mood: Secondary | ICD-10-CM | POA: Diagnosis not present

## 2023-05-01 DIAGNOSIS — F4323 Adjustment disorder with mixed anxiety and depressed mood: Secondary | ICD-10-CM | POA: Diagnosis not present

## 2023-07-20 ENCOUNTER — Encounter: Payer: Self-pay | Admitting: Obstetrics and Gynecology

## 2023-07-21 ENCOUNTER — Other Ambulatory Visit: Payer: No Typology Code available for payment source

## 2023-08-24 ENCOUNTER — Encounter: Payer: Self-pay | Admitting: Obstetrics and Gynecology

## 2023-08-25 ENCOUNTER — Ambulatory Visit
Admission: RE | Admit: 2023-08-25 | Discharge: 2023-08-25 | Disposition: A | Discharge status: Home / Self Care | Source: Ambulatory Visit | Attending: Obstetrics and Gynecology | Admitting: Obstetrics and Gynecology

## 2023-08-25 DIAGNOSIS — N62 Hypertrophy of breast: Secondary | ICD-10-CM

## 2023-08-25 MED ORDER — GADOPICLENOL 0.5 MMOL/ML IV SOLN
8.0000 mL | Freq: Once | INTRAVENOUS | Status: AC | PRN
  Administered 2023-08-25: 8 mL via INTRAVENOUS

## 2023-09-04 ENCOUNTER — Encounter: Payer: Self-pay | Admitting: Advanced Practice Midwife

## 2023-09-22 ENCOUNTER — Other Ambulatory Visit: Payer: No Typology Code available for payment source

## 2023-09-23 ENCOUNTER — Ambulatory Visit

## 2023-09-23 DIAGNOSIS — M8580 Other specified disorders of bone density and structure, unspecified site: Secondary | ICD-10-CM
